# Patient Record
Sex: Female | Born: 1955
Health system: Southern US, Community
[De-identification: ages and names within clinical notes are randomized; demographics above are authoritative.]

## PROBLEM LIST (undated history)

## (undated) DIAGNOSIS — F988 Other specified behavioral and emotional disorders with onset usually occurring in childhood and adolescence: Secondary | ICD-10-CM

## (undated) DIAGNOSIS — K449 Diaphragmatic hernia without obstruction or gangrene: Secondary | ICD-10-CM

## (undated) DIAGNOSIS — M1990 Unspecified osteoarthritis, unspecified site: Secondary | ICD-10-CM

## (undated) DIAGNOSIS — M549 Dorsalgia, unspecified: Secondary | ICD-10-CM

## (undated) DIAGNOSIS — N200 Calculus of kidney: Secondary | ICD-10-CM

## (undated) DIAGNOSIS — K589 Irritable bowel syndrome without diarrhea: Secondary | ICD-10-CM

## (undated) DIAGNOSIS — G589 Mononeuropathy, unspecified: Secondary | ICD-10-CM

## (undated) DIAGNOSIS — F419 Anxiety disorder, unspecified: Secondary | ICD-10-CM

## (undated) DIAGNOSIS — T4145XA Adverse effect of unspecified anesthetic, initial encounter: Secondary | ICD-10-CM

## (undated) DIAGNOSIS — M2550 Pain in unspecified joint: Secondary | ICD-10-CM

## (undated) DIAGNOSIS — E559 Vitamin D deficiency, unspecified: Secondary | ICD-10-CM

## (undated) DIAGNOSIS — K219 Gastro-esophageal reflux disease without esophagitis: Secondary | ICD-10-CM

## (undated) DIAGNOSIS — F410 Panic disorder [episodic paroxysmal anxiety] without agoraphobia: Secondary | ICD-10-CM

## (undated) DIAGNOSIS — Z8489 Family history of other specified conditions: Secondary | ICD-10-CM

## (undated) DIAGNOSIS — M519 Unspecified thoracic, thoracolumbar and lumbosacral intervertebral disc disorder: Secondary | ICD-10-CM

## (undated) DIAGNOSIS — Z87442 Personal history of urinary calculi: Secondary | ICD-10-CM

## (undated) DIAGNOSIS — I10 Essential (primary) hypertension: Secondary | ICD-10-CM

## (undated) HISTORY — DX: Panic disorder (episodic paroxysmal anxiety): F41.0

## (undated) HISTORY — DX: Other specified behavioral and emotional disorders with onset usually occurring in childhood and adolescence: F98.8

## (undated) HISTORY — DX: Irritable bowel syndrome, unspecified: K58.9

## (undated) HISTORY — PX: KNEE ARTHROSCOPY: SUR90

## (undated) HISTORY — DX: Mononeuropathy, unspecified: G58.9

## (undated) HISTORY — DX: Vitamin D deficiency, unspecified: E55.9

## (undated) HISTORY — DX: Dorsalgia, unspecified: M54.9

## (undated) HISTORY — DX: Diaphragmatic hernia without obstruction or gangrene: K44.9

## (undated) HISTORY — DX: Unspecified osteoarthritis, unspecified site: M19.90

## (undated) HISTORY — DX: Anxiety disorder, unspecified: F41.9

## (undated) HISTORY — PX: CHOLECYSTECTOMY: SHX55

## (undated) HISTORY — DX: Calculus of kidney: N20.0

## (undated) HISTORY — DX: Pain in unspecified joint: M25.50

---
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
## 2000-11-21 ENCOUNTER — Other Ambulatory Visit: Admission: RE | Admit: 2000-11-21 | Discharge: 2000-11-21 | Payer: Self-pay | Admitting: Obstetrics & Gynecology

## 2001-02-05 ENCOUNTER — Encounter: Payer: Self-pay | Admitting: Orthopedic Surgery

## 2001-02-05 ENCOUNTER — Encounter: Admission: RE | Admit: 2001-02-05 | Discharge: 2001-02-05 | Payer: Self-pay | Admitting: Orthopedic Surgery

## 2002-03-12 ENCOUNTER — Emergency Department (HOSPITAL_COMMUNITY): Admission: EM | Admit: 2002-03-12 | Discharge: 2002-03-12 | Payer: Self-pay | Admitting: Emergency Medicine

## 2002-03-12 ENCOUNTER — Encounter: Payer: Self-pay | Admitting: Emergency Medicine

## 2002-04-08 ENCOUNTER — Other Ambulatory Visit: Admission: RE | Admit: 2002-04-08 | Discharge: 2002-04-08 | Payer: Self-pay | Admitting: Obstetrics & Gynecology

## 2003-02-09 ENCOUNTER — Emergency Department (HOSPITAL_COMMUNITY): Admission: EM | Admit: 2003-02-09 | Discharge: 2003-02-09 | Payer: Self-pay | Admitting: Emergency Medicine

## 2006-06-22 ENCOUNTER — Other Ambulatory Visit: Admission: RE | Admit: 2006-06-22 | Discharge: 2006-06-22 | Payer: Self-pay | Admitting: Obstetrics and Gynecology

## 2006-09-22 HISTORY — PX: TOTAL ABDOMINAL HYSTERECTOMY: SHX209

## 2006-10-05 ENCOUNTER — Encounter (INDEPENDENT_AMBULATORY_CARE_PROVIDER_SITE_OTHER): Payer: Self-pay | Admitting: Specialist

## 2006-10-05 ENCOUNTER — Inpatient Hospital Stay (HOSPITAL_COMMUNITY): Admission: RE | Admit: 2006-10-05 | Discharge: 2006-10-07 | Payer: Self-pay | Admitting: Obstetrics & Gynecology

## 2006-11-05 ENCOUNTER — Encounter: Admission: RE | Admit: 2006-11-05 | Discharge: 2006-11-05 | Payer: Self-pay | Admitting: Obstetrics & Gynecology

## 2007-11-16 ENCOUNTER — Encounter: Admission: RE | Admit: 2007-11-16 | Discharge: 2008-02-14 | Payer: Self-pay | Admitting: Obstetrics & Gynecology

## 2007-12-06 ENCOUNTER — Encounter: Admission: RE | Admit: 2007-12-06 | Discharge: 2007-12-06 | Payer: Self-pay | Admitting: Obstetrics & Gynecology

## 2008-03-29 ENCOUNTER — Encounter: Admission: RE | Admit: 2008-03-29 | Discharge: 2008-03-29 | Payer: Self-pay | Admitting: Obstetrics & Gynecology

## 2009-10-04 ENCOUNTER — Encounter: Admission: RE | Admit: 2009-10-04 | Discharge: 2009-10-04 | Payer: Self-pay | Admitting: Obstetrics & Gynecology

## 2009-10-30 ENCOUNTER — Encounter: Admission: RE | Admit: 2009-10-30 | Discharge: 2009-10-30 | Payer: Self-pay | Admitting: Obstetrics & Gynecology

## 2010-05-07 ENCOUNTER — Emergency Department (HOSPITAL_COMMUNITY): Admission: EM | Admit: 2010-05-07 | Discharge: 2010-05-07 | Payer: Self-pay | Admitting: Family Medicine

## 2010-10-23 ENCOUNTER — Other Ambulatory Visit: Payer: Self-pay | Admitting: Obstetrics & Gynecology

## 2010-10-23 DIAGNOSIS — Z1231 Encounter for screening mammogram for malignant neoplasm of breast: Secondary | ICD-10-CM

## 2010-10-23 DIAGNOSIS — Z1239 Encounter for other screening for malignant neoplasm of breast: Secondary | ICD-10-CM

## 2010-10-31 ENCOUNTER — Inpatient Hospital Stay: Admission: RE | Admit: 2010-10-31 | Payer: Self-pay | Source: Ambulatory Visit

## 2011-01-29 ENCOUNTER — Inpatient Hospital Stay (INDEPENDENT_AMBULATORY_CARE_PROVIDER_SITE_OTHER)
Admission: RE | Admit: 2011-01-29 | Discharge: 2011-01-29 | Disposition: A | Discharge status: Home / Self Care | Payer: BC Managed Care – PPO | Source: Ambulatory Visit | Attending: Family Medicine | Admitting: Family Medicine

## 2011-01-29 DIAGNOSIS — T50995A Adverse effect of other drugs, medicaments and biological substances, initial encounter: Secondary | ICD-10-CM

## 2011-01-29 DIAGNOSIS — I10 Essential (primary) hypertension: Secondary | ICD-10-CM

## 2011-02-07 NOTE — Discharge Summary (Signed)
NAME:  Cynthia Li, Cynthia Li              ACCOUNT NO.:  [id number]   MEDICAL RECORD NO.:  [id number]          PATIENT TYPE:  INP   LOCATION:  9318                          FACILITY:  [facility]   PHYSICIAN:  M. Leda Quail, MD  DATE OF BIRTH:  1956-03-31   DATE OF ADMISSION:  10/05/2006  DATE OF DISCHARGE:  10/07/2006                               DISCHARGE SUMMARY   ADMISSION DIAGNOSES:  61. A 55 year old gravida 2, para 1, abortus 1, divorced white female      with menorrhagia.  2. Fibroid uterus.  3. Reflux.  4. Anxiety.   DISCHARGE DIAGNOSES:  28. A 55 year old gravida 2, para 1, abortus 1, divorced white female      with menorrhagia.  2. Fibroid uterus.  3. Reflux.  4. Anxiety.   PROCEDURE:  Total abdominal hysterectomy.   HOSPITAL COURSE:  The patient was admitted to same day surgery. She is a  55 year old white female with history of menorrhagia fibroid uterus who  decided to proceed with definitive management with hysterectomy. She  does also have a history of anemia but this has resolved with iron  therapy. The patient did undergo work-up including ultrasound, a Pap  smear and endometrial biopsy with did not show any abnormal pathology.  She did have enlarged ovarian cysts on ultrasound but with follow-up  these have completely resolved. Because of this we have decided to just  do the hysterectomy unless there are abnormalities during the time of  surgery on her ovaries. The patient was taken to the operating room in  the morning of October 07, 2006 where a total abdominal hysterectomy was  performed. The surgery went without complication. She had a 200 cc blood  loss; made appropriate urine during the procedure. An On Cue pump was  used for local pain management after the surgery as well as Dilaudid  reduced dose PCA. The patient was taken from the OR to the recovery room  and from there was transferred to the third floor for the remainder of  her hospitalization. The  patient's hospitalization went without event.  In te evening of postoperative day zero she was doing well, vital signs  were stable, she was afebrile. She did not have any nausea and had  excellent pain control. By the morning of postoperative day 1 the  patient was ready to eat. She had stable vital signs over night and was  afebrile. Her postoperative hemoglobin was 10.5, white count 9400,  platelet count 236,000.  Her electrolytes were normal.  Sodium 130,  potassium 3.7, BUN and creatinine were 5 and 0.6 and glucose was 98. As  the patient had done well overnight and had good bowel sounds, her diet  was advanced to a regular diet. Foley catheter was discontinued and her  PCA and IV were removed. The patient tolerated food well. She was able  to ambulate and void without difficulty. She was also able to shower on  postoperative day one. The remainder of postoperative day one was  without event. In the morning of postoperative day 2 she was ambulating,  voiding without difficulty, had  passed flatus and was tolerating all  pain medications by mouth. She had excellent pain control. Her exam was  benign. Her incision was clean, dry and intact. At this point discharge  was felt appropriate.  Her On Cue pump tubing was removed prior to discharge.   DISCHARGE INSTRUCTIONS:  Were provided in the written and verbal form.  She is to see me in 1 week on October 14, 2006 for an incision check.  She was given prescriptions for Percocet and Motrin to use for pain  management.   PATHOLOGY:  At this point her pathology report is still pending and will  be discussed with her at follow-up.      [name], MD  Electronically Signed     MSM/MEDQ  D:  10/07/2006  T:  10/07/2006  Job:  [id number]

## 2011-02-07 NOTE — Op Note (Signed)
NAME:  TAILEY, TOP              ACCOUNT NO.:  [id number]   MEDICAL RECORD NO.:  [id number]          PATIENT TYPE:  AMB   LOCATION:  SDC                           FACILITY:  [facility]   PHYSICIAN:  M. Leda Quail, MD  DATE OF BIRTH:  [birth year]   DATE OF PROCEDURE:  10/05/2006  DATE OF DISCHARGE:                               OPERATIVE REPORT   PREOPERATIVE DIAGNOSES:  68. A 55 year old G2, P1, A1 divorced white female with menorrhagia.  2. Dysmenorrhea.  3. Fibroid uterus.  4. Reflux disease.  5. Obesity.   POSTOPERATIVE DIAGNOSES:  79. A 55 year old G2, P1, A1 divorced white female with menorrhagia.  2. Dysmenorrhea.  3. Fibroid uterus.  4. Reflux disease.  5. Obesity.   PROCEDURE:  TAH.   SURGEON:  M. Leda Quail, MD.   ASSISTANTAram Beecham P. Romine, M.D.   ANESTHESIA:  General endotracheal anesthesia.   SPECIMENS:  Uterus and cervix to pathology.   ESTIMATED BLOOD LOSS:  200 mL.   URINE OUTPUT:  150 mL clear urine.   FLUIDS:  2100 mL of LR.   COMPLICATIONS:  None.   INDICATIONS FOR PROCEDURE:  Ms. Eckman is a very pleasant 55 year old  white female with a history of symptomatic uterine fibroids including  menorrhagia and dysmenorrhea.  She has also had anemia with a hemoglobin  down to 10.  We have, with iron, improved her hemoglobin to 13.  Because  of the amount of bleeding that she has had, she also underwent  evaluation with endometrial biopsy which showed no pathology.  We did  discuss conservative management with an IUD, simply birth control pills  or endometrial ablation but she is interested in definitive management  and decided to proceed with hysterectomy.  She is here for this today.   PROCEDURE:  Patient is taken to the operating room.  She is placed in  the supine position.  General endotracheal anesthesia is administered by  the anesthesia staff.  Legs are positioned in the frog-leg position.  Abdomen, peritoneum, inner thighs and vagina are  prepped in normal  sterile fashion.  Foley catheter is inserted under sterile conditions.  Legs are straightened.  Patient is draped in a normal sterile fashion.   Using a knife, a Pfannenstiel skin incision is made and carried down  through the subcutaneous fat and tissue.  Fascia is identified and  nicked in the midline.  The fascial incision is extended laterally with  curved Mayo scissors.  Kocher clamps were applied to the superior aspect  of the fascial incision and the underlying rectus muscles are taken off  of the fascia bluntly and sharply in a similar fashion.  Kocher clamps  are applied to inferior aspect of the fascial incision and the fascia is  elevated, the underlying rectus muscles are dissected off bluntly and  sharply.  Rectus muscles are divided in the midline.  The free  peritoneal fat is divided superiorly, the peritoneum is entered bluntly.  The peritoneal incision is extended superiorly and inferiorly with care  taken to note location of the bladder.   Initially, an abdominal  retractor was used but I felt there was an  adequate visualization.  Therefore, a Engineer, manufacturing was obtained.  A  bladder blade was attached to the rectractor inferiorly.  Three  moistened laparotomy sponges were used to pack the bowel superiorly and  an upper blade was attached to the Balfour retractor with a malleable  retractor to keep the bowel out of the surgical field.   There is a small amount of bowel adhesion to the ovary on the left side.  This was taken off sharply.  The Kelly clamps were placed on either side  of the utero-ovarian pedicles.  Attention was turned to the right round  ligament.  This was suture ligated with 0 Vicryl and incised with  cautery.  Anterior leaf of the broad ligament was opened to the level of  the internal os anteriorly.  Then the posterior leaf of the broad  ligament was opened parallel to the IP ligament.  The utero-ovarian  pedicle was isolated,  clamped with curved Heaney clamp, back-clamping  with Kelly clamp was used.  This pedicle was transected and suture  ligated with free tie of 0 Vicryl and a stitch tie of 0 Vicryl.   Attention was turned to the left side.  The left round ligament was  suture ligated with 0 Vicryl and incised with a cautery.  Then the  anterior leaf of the broad ligament was opened to the level of the  internal os and connecting with the previous incision done on the right  side.  Then the posterior leaf of the broad ligament was opened incising  parallel to the IP ligament.  The utero-ovarian pedicle was isolated on  the left side.  Clamped with a curved Heaney clamp.  The pedicle was  then incised and suture ligated with free tie of 0 Vicryl and then with  a stitch tie.  Attention was turned anteriorly to the bladder.  The  pubovesicale cervical fascia was dissected and using a sponge stick the  bladder was pushed down.  The cervix and glistening white tissue of the  cervix was well visualized.  Patient had no previous surgeries.  This  went without complications.  Attention was then turned to the patient's  right side again. The uterine artery was then skeletonized, clamped with  a curved Heaney clamp.  Back-clamping was performed.  This pedicle was  transected and suture ligated with two stitch ties of 0 Vicryl.  In a  similar fashion, on the opposite side, the uterine artery was  skeletonized, clamped with a curved Heaney clamp, transected and suture  ligated with two stitches of 0 Vicryl.  Then the cardinal ligaments were  serially clamped.  Transected and suture ligated with 0 Vicryl down  either side of the uterus.  Further dissection of the bladder flap was  performed as necessary.  At the base of the cervix, a curved Heaney  clamp was used to clamp the uterosacral ligament.  This was done on each side.  This pedicle was transected and suture ligated with Heaney stitch  of 0 Vicryl.  Then curved  Heaney clamps were placed on either side of  the corner of the cervix.  Pedicles were transected and the vagina was  entered.  Pedicle was suture ligated with a Heaney stitch of 0 Vicryl.  These stitches were left long.  Then using Jorgenson scissors, the  vaginal mucosa was incised circumferentially, keeping close to the  cervix.  Kocher clamps were used to  grasp the vaginal mucosa.  Once the  uterus and cervix were freed from the vaginal mucosa, they were handed  off to be sent to pathology.  At this point, the corners of the vaginal  cuff were stitched with a 0 Vicryl and attached to the ipsilateral  uterosacral ligament.  Care was taken to ensure that the corner of the  vaginal mucosa was well within the stitch. Then the remainder of the  vagina was closed with four interrupted figure-of-eight sutures of 0  Vicryl.  At this point, pelvis was irrigated with copious amounts of  warm normal saline.  There was a small amount of bleeding on the right  side at the level of one of the cardinal ligament pedicles.  A  superficial figure-of-eight suture of 0 Vicryl was used to obtain  excellent hemostasis on this side.  The utero-ovarian pedicles were  hemostatic.  Uterine arteries were hemostatic.  Round ligaments were  hemostatic.  Vaginal cuff was hemostatic.  At this point, all sutures  were cut short.  The ovaries were attached to the ipsilateral round  ligaments keeping them out of the pelvis.  At this point, no bleeding  was noted and the procedure was noted and the procedure was ended.   The Balfour retractor, bladder blade and upper blade were removed.  The  laparotomy sponges were removed.  There were three that were present and  removed.  Bowel was placed back in normal anatomic position with the  omentum on top of it.  The peritoneum was closed using a running stitch  of 0 Vicryl.  The subfascial tissue and rectus muscle were visualized.  No bleeding was noted.  An on-cue pump  tubing was placed subfascially on  the patient's right side.  Steri-Strips were used to hold the tubing in  place at skin level.  Then the fascia was closed with a running stitch  of 0 Vicryl starting at the corners and running to the midline.  This  was done bilaterally.  The subcutaneous fat and tissues irrigated.  Hemostasis was achieved with cautery.  The second on-cue pump tubing was  placed on the patient's left side.  This was placed subcutaneously.  The  skin was closed with a subcuticular stitch of 3-0 Vicryl.  Benzoin and  Steri-Strips were applied to the incision.  The left on-cue pump tubing  was attached to the skin using Steri-Strips.  Then Op-Sites were used to  dress the incisions.  The on-cue pump tubing was then primed with 5 mL  of 1% Xylocaine subcutaneously and 10 mL 1% Xylocaine subfascially and  lidocaine was placed in the on-cue pump to be used for topical  anesthesia later.   The patient tolerated the procedure well.  Sponge, lap, needle and  instrument counts correct x2.  She was extubated from general  endotracheal anesthesia without difficulty and taken to the recovery  room in stable condition.      [name], MD  Electronically Signed     MSM/MEDQ  D:  10/05/2006  T:  10/05/2006  Job:  [id number]

## 2011-03-11 ENCOUNTER — Ambulatory Visit
Admission: RE | Admit: 2011-03-11 | Discharge: 2011-03-11 | Disposition: A | Discharge status: Home / Self Care | Payer: BC Managed Care – PPO | Source: Ambulatory Visit | Attending: Obstetrics & Gynecology | Admitting: Obstetrics & Gynecology

## 2011-03-11 DIAGNOSIS — Z1231 Encounter for screening mammogram for malignant neoplasm of breast: Secondary | ICD-10-CM

## 2012-05-10 ENCOUNTER — Other Ambulatory Visit: Payer: Self-pay | Admitting: Obstetrics & Gynecology

## 2012-05-10 DIAGNOSIS — Z1231 Encounter for screening mammogram for malignant neoplasm of breast: Secondary | ICD-10-CM

## 2012-05-18 ENCOUNTER — Ambulatory Visit: Payer: BC Managed Care – PPO

## 2012-11-16 ENCOUNTER — Ambulatory Visit
Admission: RE | Admit: 2012-11-16 | Discharge: 2012-11-16 | Disposition: A | Discharge status: Home / Self Care | Payer: BC Managed Care – PPO | Source: Ambulatory Visit | Attending: Obstetrics & Gynecology | Admitting: Obstetrics & Gynecology

## 2012-11-20 DIAGNOSIS — N200 Calculus of kidney: Secondary | ICD-10-CM

## 2012-11-20 HISTORY — DX: Calculus of kidney: N20.0

## 2012-12-06 ENCOUNTER — Encounter: Payer: Self-pay | Admitting: Obstetrics & Gynecology

## 2012-12-21 ENCOUNTER — Ambulatory Visit: Payer: Self-pay | Admitting: Obstetrics & Gynecology

## 2013-03-03 ENCOUNTER — Telehealth: Payer: Self-pay | Admitting: *Deleted

## 2013-03-03 NOTE — Telephone Encounter (Signed)
Message left for pt to return call regarding 6/20 appt.

## 2013-03-09 ENCOUNTER — Encounter: Payer: Self-pay | Admitting: Obstetrics & Gynecology

## 2013-03-11 ENCOUNTER — Ambulatory Visit: Payer: Self-pay | Admitting: Obstetrics & Gynecology

## 2013-03-23 ENCOUNTER — Ambulatory Visit (INDEPENDENT_AMBULATORY_CARE_PROVIDER_SITE_OTHER): Payer: BC Managed Care – PPO | Admitting: Obstetrics & Gynecology

## 2013-03-23 ENCOUNTER — Encounter: Payer: Self-pay | Admitting: Obstetrics & Gynecology

## 2013-03-23 VITALS — BP 120/72 | HR 60 | Resp 16 | Ht 65.0 in | Wt 206.4 lb

## 2013-03-23 DIAGNOSIS — Z0000 Encounter for general adult medical examination without abnormal findings: Secondary | ICD-10-CM

## 2013-03-23 DIAGNOSIS — Z01419 Encounter for gynecological examination (general) (routine) without abnormal findings: Secondary | ICD-10-CM

## 2013-03-23 DIAGNOSIS — N390 Urinary tract infection, site not specified: Secondary | ICD-10-CM

## 2013-03-23 LAB — POCT URINALYSIS DIPSTICK
Bilirubin, UA: NEGATIVE
Glucose, UA: NEGATIVE
Urobilinogen, UA: NEGATIVE
pH, UA: 5

## 2013-03-23 LAB — HEMOGLOBIN, FINGERSTICK: Hemoglobin, fingerstick: 12.6 g/dL (ref 12.0–16.0)

## 2013-03-23 MED ORDER — PHENTERMINE HCL 15 MG PO CAPS: 15.0000 mg | ORAL_CAPSULE | ORAL | Status: DC

## 2013-03-23 NOTE — Progress Notes (Signed)
57 y.o. [id number] SingleCaucasianF here for annual exam.  Reports that she was treated for a UTI.  Finished antibiotics 03/21/13.  Working on dietary changes.  She is having a lot of back issues.  She has an appointment at [facility].    Patient's last menstrual period was 09/22/2006.          Sexually active: no  The current method of family planning is status post hysterectomy.    Exercising: no  not regularly Smoker:  no  Health Maintenance: Pap:  06/22/06 WNL History of abnormal Pap:  no MMG:  11/16/12 3D normal Colonoscopy:  None, refuses BMD:   2011 normal TDaP:  2013, [facility] Labs: [facility], Hb today: 12.6, Urine today: WBC-trace, PROTEIN-trace, RBC-trace   reports that she has never smoked. She has never used smokeless tobacco. She reports that  drinks alcohol. She reports that she does not use illicit drugs.  Past Medical History  Diagnosis Date  . IBS (irritable bowel syndrome)     age 57  . Panic disorder   . Kidney stone 3/14    no surgery    Past Surgical History  Procedure Laterality Date  . Total abdominal hysterectomy  09/2006  . Cholecystectomy  age 39  . Knee arthroscopy      right    Current Outpatient Prescriptions  Medication Sig Dispense Refill  . cholecalciferol (VITAMIN D) 1000 UNITS tablet Take 1,000 Units by mouth daily.      . CHROMIUM PICOLINATE PO Take by mouth.      . Flaxseed, Linseed, (FLAXSEED OIL PO) Take by mouth.      Marland Kitchen GINSENG PO Take by mouth.      . Glucosamine HCl (GLUCOSAMINE PO) Take by mouth daily.      Marland Kitchen KRILL OIL PO Take by mouth. Omega 3 red krill oil      . Multiple Vitamin (MULTIVITAMIN) capsule Take 1 capsule by mouth daily.      . pantoprazole (PROTONIX) 20 MG tablet Take 40 mg by mouth daily.      . vitamin E 400 UNIT capsule Take 400 Units by mouth daily.      Marland Kitchen glucosamine-chondroitin 500-400 MG tablet Take 1 tablet by mouth 3 (three) times daily.       No current facility-administered medications for  this visit.    Family History  Problem Relation Age of Onset  . Parkinson's disease Father     deceased  . Cancer Father     renal tumor  . Ulcerative colitis Sister   . Heart attack Father   . Alzheimer's disease Father     ROS:  Pertinent items are noted in HPI.  Otherwise, a comprehensive ROS was negative.  Exam:   BP 120/72  Pulse 60  Resp 16  Ht 5\' 5"  (1.651 m)  Wt 206 lb 6.4 oz (93.622 kg)  BMI 34.35 kg/m2  LMP 09/22/2006  Weight change: -6lbs  Height: 5\' 5"  (165.1 cm)  Ht Readings from Last 3 Encounters:  03/23/13 5\' 5"  (1.651 m)    General appearance: alert, cooperative and appears stated age Head: Normocephalic, without obvious abnormality, atraumatic Neck: no adenopathy, supple, symmetrical, trachea midline and thyroid normal to inspection and palpation Lungs: clear to auscultation bilaterally Breasts: normal appearance, no masses or tenderness Heart: regular rate and rhythm Abdomen: soft, non-tender; bowel sounds normal; no masses,  no organomegaly Extremities: extremities normal, atraumatic, no cyanosis or edema Skin: Skin color, texture, turgor normal. No rashes or  lesions Lymph nodes: Cervical, supraclavicular, and axillary nodes normal. No abnormal inguinal nodes palpated Neurologic: Grossly normal   Pelvic: External genitalia:  no lesions              Urethra:  normal appearing urethra with no masses, tenderness or lesions              Bartholins and Skenes: normal                 Vagina: normal appearing vagina with normal color and discharge, no lesions              Cervix: absent              Pap taken: no Bimanual Exam:  Uterus:  uterus absent              Adnexa: normal adnexa and no mass, fullness, tenderness               Rectovaginal: Confirms               Anus:  normal sphincter tone, no lesions  A:  Well Woman with normal exam Labs with PCP Declines colonoscopy!!   Elevated Lipids Desires weight loss S/P TAH, no HRT  P:    Mammogram yearly No pap smear.  H/O TAH. OC light return annually or prn  An After Visit Summary was printed and given to the patient.

## 2013-03-23 NOTE — Patient Instructions (Addendum)

## 2013-03-24 LAB — URINE CULTURE
Colony Count: NO GROWTH
Organism ID, Bacteria: NO GROWTH

## 2013-03-28 ENCOUNTER — Telehealth: Payer: Self-pay

## 2013-03-28 NOTE — Telephone Encounter (Signed)
7/7 patient notified of all results.

## 2013-03-28 NOTE — Telephone Encounter (Signed)
Message copied by Elisha Headland on Mon Mar 28, 2013  9:08 AM ------      Message from: Jerene Bears      Created: Sat Mar 26, 2013  9:16 PM       Inform culture negative.  Urine had mild findings at AEX.  No abx needed. ------

## 2013-03-28 NOTE — Telephone Encounter (Signed)
7/7 lmtcb//kn

## 2013-04-22 ENCOUNTER — Telehealth: Payer: Self-pay | Admitting: Obstetrics & Gynecology

## 2013-04-22 NOTE — Telephone Encounter (Signed)
Patient wants Dr.Miller to call her regarding her back. She has talked to Dr. Hyacinth Meeker before about this. Patient is going into a meeting 3 :30 pm.

## 2013-04-25 NOTE — Telephone Encounter (Signed)
LMTCB that I am returning call for Dr Hyacinth Meeker.

## 2013-04-28 ENCOUNTER — Ambulatory Visit: Payer: BC Managed Care – PPO | Admitting: Obstetrics & Gynecology

## 2013-05-09 ENCOUNTER — Telehealth: Payer: Self-pay | Admitting: Obstetrics & Gynecology

## 2013-05-09 NOTE — Telephone Encounter (Signed)
Patient one month follow up appt. With Dr. Hyacinth Meeker cancelled per patient request . Stated she did not ever get Rx for Phentermine filled and did not need to keep appt.. Patient would like a call back concerning a procedure at [facility]. See note below.

## 2013-05-09 NOTE — Telephone Encounter (Signed)
Call to patient to adjust her appt time. She asked if we could cancel the appointment since she never started the medicine and said she will call back to reschedule. She asked to speak with Tresa Endo. She has a question about a spinal procedure that [facility], Dr. Roetta Sessions had recommended. She wants Dr. Rondel Baton input before she agrees to the procedure.

## 2013-05-10 NOTE — Telephone Encounter (Signed)
I left her a message to call back, have not heard from her. Do you want me to continue to try her or have you also contacted her? Please advise.

## 2013-05-12 ENCOUNTER — Ambulatory Visit: Payer: BC Managed Care – PPO | Admitting: Obstetrics & Gynecology

## 2013-05-13 NOTE — Telephone Encounter (Signed)
Left msg for pt TCB.

## 2013-10-11 ENCOUNTER — Other Ambulatory Visit: Payer: Self-pay

## 2013-10-11 DIAGNOSIS — Z1231 Encounter for screening mammogram for malignant neoplasm of breast: Secondary | ICD-10-CM

## 2013-11-17 ENCOUNTER — Ambulatory Visit: Payer: BC Managed Care – PPO

## 2013-12-01 ENCOUNTER — Ambulatory Visit
Admission: RE | Admit: 2013-12-01 | Discharge: 2013-12-01 | Disposition: A | Discharge status: Home / Self Care | Payer: BC Managed Care – PPO | Source: Ambulatory Visit

## 2013-12-01 DIAGNOSIS — Z1231 Encounter for screening mammogram for malignant neoplasm of breast: Secondary | ICD-10-CM

## 2014-06-01 ENCOUNTER — Ambulatory Visit (INDEPENDENT_AMBULATORY_CARE_PROVIDER_SITE_OTHER): Payer: BC Managed Care – PPO | Admitting: Obstetrics & Gynecology

## 2014-06-01 ENCOUNTER — Encounter: Payer: Self-pay | Admitting: Obstetrics & Gynecology

## 2014-06-01 VITALS — BP 132/78 | HR 64 | Resp 16 | Ht 65.0 in | Wt 214.8 lb

## 2014-06-01 DIAGNOSIS — Z01419 Encounter for gynecological examination (general) (routine) without abnormal findings: Secondary | ICD-10-CM

## 2014-06-01 DIAGNOSIS — N3000 Acute cystitis without hematuria: Secondary | ICD-10-CM

## 2014-06-01 DIAGNOSIS — N3001 Acute cystitis with hematuria: Secondary | ICD-10-CM

## 2014-06-01 DIAGNOSIS — Z0000 Encounter for general adult medical examination without abnormal findings: Secondary | ICD-10-CM

## 2014-06-01 DIAGNOSIS — R3129 Other microscopic hematuria: Secondary | ICD-10-CM

## 2014-06-01 DIAGNOSIS — Z1211 Encounter for screening for malignant neoplasm of colon: Secondary | ICD-10-CM

## 2014-06-01 LAB — POCT URINALYSIS DIPSTICK
BILIRUBIN UA: NEGATIVE
GLUCOSE UA: NEGATIVE
KETONES UA: NEGATIVE
Nitrite, UA: NEGATIVE
PH UA: 5
Urobilinogen, UA: NEGATIVE

## 2014-06-01 LAB — HEMOGLOBIN, FINGERSTICK: Hemoglobin, fingerstick: 13.8 g/dL (ref 12.0–16.0)

## 2014-06-01 MED ORDER — SULFAMETHOXAZOLE-TMP DS 800-160 MG PO TABS
1.0000 | ORAL_TABLET | Freq: Two times a day (BID) | ORAL | Status: DC
Start: 2014-06-01 — End: 2014-10-14

## 2014-06-01 NOTE — Progress Notes (Signed)
58 y.o. [id number] SingleCaucasianF here for annual exam.  Doing well.  Clayton Bibles, is four.   No vaginal bleeding.  Pt reports she feels like she has a UTI.  Having some increased urination and some low back pain.   Patient's last menstrual period was 09/22/2006.          Sexually active: No.  The current method of family planning is status post hysterectomy.    Exercising: Yes.    some Smoker:  no  Health Maintenance: Pap:  06/22/06 WNL History of abnormal Pap:  no MMG:  12/02/13 3D-normal Colonoscopy:  none BMD:   2011-normal TDaP:  Will check with PCP Screening Labs: appt November with PCP, Hb today: 13.8, Urine today: WBC-+2, PROTEIN-+2, RBC-+2   reports that she has never smoked. She has never used smokeless tobacco. She reports that she drinks alcohol. She reports that she does not use illicit drugs.  Past Medical History  Diagnosis Date  . IBS (irritable bowel syndrome)     age 23  . Panic disorder   . Kidney stone 3/14    no surgery  . Hiatal hernia   . Pinched nerve     some DDD    Past Surgical History  Procedure Laterality Date  . Total abdominal hysterectomy  09/2006  . Cholecystectomy  age 67  . Knee arthroscopy      right    Current Outpatient Prescriptions  Medication Sig Dispense Refill  . cholecalciferol (VITAMIN D) 1000 UNITS tablet Take 1,000 Units by mouth daily.      . CHROMIUM PICOLINATE PO Take by mouth.      . Flaxseed, Linseed, (FLAXSEED OIL PO) Take by mouth.      Marland Kitchen GINSENG PO Take by mouth.      . Glucosamine HCl (GLUCOSAMINE PO) Take by mouth daily.      Marland Kitchen glucosamine-chondroitin 500-400 MG tablet Take 1 tablet by mouth 3 (three) times daily.      Marland Kitchen KRILL OIL PO Take by mouth. Omega 3 red krill oil      . Multiple Vitamin (MULTIVITAMIN) capsule Take 1 capsule by mouth daily.      . pantoprazole (PROTONIX) 20 MG tablet Take 40 mg by mouth daily.      . vitamin E 400 UNIT capsule Take 400 Units by mouth daily.       No current  facility-administered medications for this visit.    Family History  Problem Relation Age of Onset  . Parkinson's disease Father     deceased  . Cancer Father     renal tumor  . Ulcerative colitis Sister   . Heart attack Father   . Alzheimer's disease Father     ROS:  Pertinent items are noted in HPI.  Otherwise, a comprehensive ROS was negative.  Exam:   BP 132/78  Pulse 64  Resp 16  Ht  (1.651 m)  Wt 214 lb 12.8 oz (97.433 kg)  BMI 35.74 kg/m2  LMP 09/22/2006  Weight change: +8#   Height:  (165.1 cm)  Ht Readings from Last 3 Encounters:  06/01/14  (1.651 m)  03/23/13  (1.651 m)    General appearance: alert, cooperative and appears stated age Head: Normocephalic, without obvious abnormality, atraumatic Neck: no adenopathy, supple, symmetrical, trachea midline and thyroid normal to inspection and palpation Lungs: clear to auscultation bilaterally Breasts: normal appearance, no masses or tenderness Heart: regular rate and rhythm Abdomen: soft, non-tender; bowel sounds normal; no  masses,  no organomegaly Extremities: extremities normal, atraumatic, no cyanosis or edema Skin: Skin color, texture, turgor normal. No rashes or lesions Lymph nodes: Cervical, supraclavicular, and axillary nodes normal. No abnormal inguinal nodes palpated Neurologic: Grossly normal   Pelvic: External genitalia:  no lesions              Urethra:  normal appearing urethra with no masses, tenderness or lesions              Bartholins and Skenes: normal                 Vagina: normal appearing vagina with normal color and discharge, no lesions              Cervix: absent              Pap taken: No. Bimanual Exam:  Uterus:  uterus absent              Adnexa: normal adnexa and no mass, fullness, tenderness               Rectovaginal: Confirms               Anus:  normal sphincter tone, no lesions  A:  Well Woman with normal exam  Labs with PCP  Declines colonoscopy!!  IFOB  given. Elevated Lipids   S/P TAH, no HRT   P: Mammogram yearly. No pap smear. H/O TAH.  OC light given today Will see Dr. Sharon Mt next month.   return annually or prn  An After Visit Summary was printed and given to the patient.

## 2014-06-03 LAB — URINE CULTURE

## 2014-06-04 NOTE — Addendum Note (Signed)
Addended by: Jerene Bears on: 06/04/2014 03:16 PM   Modules accepted: Orders

## 2014-06-05 ENCOUNTER — Telehealth: Payer: Self-pay

## 2014-06-05 NOTE — Telephone Encounter (Signed)
Message copied by Elisha Headland on Mon Jun 05, 2014  9:45 AM ------      Message from: Jerene Bears      Created: Sun Jun 04, 2014  3:15 PM       Inform culture grew bacteria but nothing specific.  Finish antibiotics and return for urinalysis only.  THanks.  Order placed. ------

## 2014-06-05 NOTE — Telephone Encounter (Signed)
Lmtcb//kn 

## 2014-06-12 NOTE — Telephone Encounter (Signed)
Patient notified by R. Thresa Ross, CMA, of results.//kn

## 2014-06-13 ENCOUNTER — Telehealth: Payer: Self-pay | Admitting: Obstetrics & Gynecology

## 2014-06-13 NOTE — Telephone Encounter (Signed)
Pt is calling kaitlyn back said to tell them its an emergnecy and to put her through to [employer]

## 2014-06-13 NOTE — Telephone Encounter (Signed)
Left message to call Symeon Puleo at [phone]. 

## 2014-06-13 NOTE — Telephone Encounter (Signed)
Requesting results of blood work from annual.  Advised of hemoglobin results. Hemoglobin, fingerstick 12.0 - 16.0 g/dL  16.1   Advised normal. Patient also would like to schedule urine test of cure.  Scheduled for 06/15/14 at 1600. Patient agreeable.  Routing to provider for final review. Patient agreeable to disposition. Will close encounter

## 2014-06-13 NOTE — Telephone Encounter (Signed)
Patient is asking for recent lab results from [facility].

## 2014-06-15 ENCOUNTER — Ambulatory Visit: Payer: BC Managed Care – PPO

## 2014-06-19 ENCOUNTER — Ambulatory Visit (INDEPENDENT_AMBULATORY_CARE_PROVIDER_SITE_OTHER): Payer: BC Managed Care – PPO

## 2014-06-19 VITALS — BP 118/82 | HR 72 | Ht 65.0 in | Wt 218.0 lb

## 2014-06-19 DIAGNOSIS — R3129 Other microscopic hematuria: Secondary | ICD-10-CM

## 2014-06-19 NOTE — Progress Notes (Signed)
Pt came in for a nurse visit to give a urine TOC Pt states no sx and has finished antibiotics.  Micro sent to lab

## 2014-06-20 LAB — URINALYSIS, MICROSCOPIC ONLY
Bacteria, UA: NONE SEEN
CRYSTALS: NONE SEEN
Casts: NONE SEEN
SQUAMOUS EPITHELIAL / LPF: NONE SEEN

## 2014-07-05 LAB — FECAL OCCULT BLOOD, IMMUNOCHEMICAL: IMMUNOLOGICAL FECAL OCCULT BLOOD TEST: NEGATIVE

## 2014-07-24 ENCOUNTER — Encounter: Payer: Self-pay | Admitting: Obstetrics & Gynecology

## 2014-07-24 ENCOUNTER — Other Ambulatory Visit: Payer: Self-pay | Admitting: Neurosurgery

## 2014-07-24 DIAGNOSIS — M545 Low back pain, unspecified: Secondary | ICD-10-CM

## 2014-07-26 ENCOUNTER — Ambulatory Visit
Admission: RE | Admit: 2014-07-26 | Discharge: 2014-07-26 | Disposition: A | Discharge status: Home / Self Care | Payer: BC Managed Care – PPO | Source: Ambulatory Visit | Attending: Neurosurgery | Admitting: Neurosurgery

## 2014-07-26 DIAGNOSIS — M545 Low back pain, unspecified: Secondary | ICD-10-CM

## 2014-09-10 ENCOUNTER — Encounter (HOSPITAL_COMMUNITY): Payer: Self-pay | Admitting: Emergency Medicine

## 2014-09-10 ENCOUNTER — Emergency Department (HOSPITAL_COMMUNITY)
Admission: EM | Admit: 2014-09-10 | Discharge: 2014-09-10 | Disposition: A | Discharge status: Home / Self Care | Payer: BC Managed Care – PPO | Attending: Emergency Medicine | Admitting: Emergency Medicine

## 2014-09-10 ENCOUNTER — Emergency Department (HOSPITAL_COMMUNITY): Payer: BC Managed Care – PPO

## 2014-09-10 DIAGNOSIS — Z8669 Personal history of other diseases of the nervous system and sense organs: Secondary | ICD-10-CM | POA: Diagnosis not present

## 2014-09-10 DIAGNOSIS — N200 Calculus of kidney: Secondary | ICD-10-CM

## 2014-09-10 DIAGNOSIS — Z8719 Personal history of other diseases of the digestive system: Secondary | ICD-10-CM | POA: Insufficient documentation

## 2014-09-10 DIAGNOSIS — R109 Unspecified abdominal pain: Secondary | ICD-10-CM | POA: Diagnosis present

## 2014-09-10 DIAGNOSIS — R300 Dysuria: Secondary | ICD-10-CM

## 2014-09-10 DIAGNOSIS — Z79899 Other long term (current) drug therapy: Secondary | ICD-10-CM | POA: Diagnosis not present

## 2014-09-10 DIAGNOSIS — Z8659 Personal history of other mental and behavioral disorders: Secondary | ICD-10-CM | POA: Insufficient documentation

## 2014-09-10 DIAGNOSIS — Z880 Allergy status to penicillin: Secondary | ICD-10-CM | POA: Diagnosis not present

## 2014-09-10 DIAGNOSIS — R319 Hematuria, unspecified: Secondary | ICD-10-CM

## 2014-09-10 DIAGNOSIS — R110 Nausea: Secondary | ICD-10-CM

## 2014-09-10 DIAGNOSIS — Z792 Long term (current) use of antibiotics: Secondary | ICD-10-CM | POA: Insufficient documentation

## 2014-09-10 LAB — LIPASE, BLOOD: Lipase: 29 U/L (ref 11–59)

## 2014-09-10 LAB — COMPREHENSIVE METABOLIC PANEL
ALT: 15 U/L (ref 0–35)
AST: 19 U/L (ref 0–37)
Albumin: 3.9 g/dL (ref 3.5–5.2)
Alkaline Phosphatase: 92 U/L (ref 39–117)
Anion gap: 13 (ref 5–15)
BILIRUBIN TOTAL: 0.4 mg/dL (ref 0.3–1.2)
BUN: 9 mg/dL (ref 6–23)
CALCIUM: 9.9 mg/dL (ref 8.4–10.5)
CO2: 25 meq/L (ref 19–32)
Chloride: 100 mEq/L (ref 96–112)
Creatinine, Ser: 0.74 mg/dL (ref 0.50–1.10)
GLUCOSE: 115 mg/dL — AB (ref 70–99)
Potassium: 3.9 mEq/L (ref 3.7–5.3)
Sodium: 138 mEq/L (ref 137–147)
Total Protein: 7.2 g/dL (ref 6.0–8.3)

## 2014-09-10 LAB — CBC WITH DIFFERENTIAL/PLATELET
Basophils Absolute: 0 10*3/uL (ref 0.0–0.1)
Basophils Relative: 0 % (ref 0–1)
Eosinophils Absolute: 0.1 10*3/uL (ref 0.0–0.7)
Eosinophils Relative: 1 % (ref 0–5)
HEMATOCRIT: 39.5 % (ref 36.0–46.0)
Hemoglobin: 13.4 g/dL (ref 12.0–15.0)
LYMPHS ABS: 1.2 10*3/uL (ref 0.7–4.0)
LYMPHS PCT: 14 % (ref 12–46)
MCH: 30.5 pg (ref 26.0–34.0)
MCHC: 33.9 g/dL (ref 30.0–36.0)
MCV: 90 fL (ref 78.0–100.0)
Monocytes Absolute: 0.6 10*3/uL (ref 0.1–1.0)
Monocytes Relative: 7 % (ref 3–12)
Neutro Abs: 6.9 10*3/uL (ref 1.7–7.7)
Neutrophils Relative %: 78 % — ABNORMAL HIGH (ref 43–77)
PLATELETS: 256 10*3/uL (ref 150–400)
RBC: 4.39 MIL/uL (ref 3.87–5.11)
RDW: 12.6 % (ref 11.5–15.5)
WBC: 8.9 10*3/uL (ref 4.0–10.5)

## 2014-09-10 LAB — URINALYSIS, ROUTINE W REFLEX MICROSCOPIC
Bilirubin Urine: NEGATIVE
GLUCOSE, UA: NEGATIVE mg/dL
Ketones, ur: NEGATIVE mg/dL
Nitrite: NEGATIVE
PH: 6.5 (ref 5.0–8.0)
Protein, ur: NEGATIVE mg/dL
SPECIFIC GRAVITY, URINE: 1.005 (ref 1.005–1.030)
Urobilinogen, UA: 0.2 mg/dL (ref 0.0–1.0)

## 2014-09-10 LAB — URINE MICROSCOPIC-ADD ON

## 2014-09-10 MED ORDER — ONDANSETRON 4 MG PO TBDP: 4.0000 mg | ORAL_TABLET | Freq: Three times a day (TID) | ORAL | Status: DC | PRN

## 2014-09-10 MED ORDER — HYDROCODONE-ACETAMINOPHEN 5-325 MG PO TABS: 1.0000 | ORAL_TABLET | Freq: Four times a day (QID) | ORAL | Status: DC | PRN

## 2014-09-10 MED ORDER — MORPHINE SULFATE 4 MG/ML IJ SOLN
4.0000 mg | Freq: Once | INTRAMUSCULAR | Status: DC
  Filled 2014-09-10: qty 1

## 2014-09-10 MED ORDER — KETOROLAC TROMETHAMINE 30 MG/ML IJ SOLN
30.0000 mg | Freq: Once | INTRAMUSCULAR | Status: AC
  Administered 2014-09-10: 30 mg via INTRAVENOUS

## 2014-09-10 MED ORDER — KETOROLAC TROMETHAMINE 30 MG/ML IJ SOLN
30.0000 mg | Freq: Once | INTRAMUSCULAR | Status: DC
  Filled 2014-09-10: qty 1

## 2014-09-10 MED ORDER — NAPROXEN 500 MG PO TABS: 500.0000 mg | ORAL_TABLET | Freq: Two times a day (BID) | ORAL | Status: DC | PRN

## 2014-09-10 MED ORDER — SODIUM CHLORIDE 0.9 % IV BOLUS (SEPSIS)
1000.0000 mL | Freq: Once | INTRAVENOUS | Status: AC
  Administered 2014-09-10: 1000 mL via INTRAVENOUS

## 2014-09-10 NOTE — ED Provider Notes (Signed)
CSN: [id number]     Arrival date & time 09/10/14  1027 History   First MD Initiated Contact with Patient 09/10/14 1122     No chief complaint on file.    (Consider location/radiation/quality/duration/timing/severity/associated sxs/prior Treatment) HPI Comments: Cynthia Li is a 58 y.o. female with a PMHx of IBS, panic attacks, nephrolithiasis, hiatal hernia, and DDD with "pinched nerve", who presents to the ED with complaints of sudden onset left flank pain that began at 8:45 AM. Patient reports that the pain was 9/10 sharp left flank pain that radiated into the left lateral aspect of her abdomen, worsened with movement, and completely resolved after taking 3 ibuprofen tablets. She is no longer experiencing this pain. She said at the time she experienced associated nausea and loose watery stools 2. She reports associated increased urinary frequency and urgency, dysuria, and malodorous urine. She states that one year ago she had an MRI which found a small left kidney stone, and at the time they stated that she did not have any need for urology follow-up. She states that typically if she drinks copious amounts of water she doesn't have any problems, but that today she had not been drinking enough water. Denies fevers, chills, neck/back pain, CP, SOB, vomiting, constipation, obstipation, melena, hematochezia, hematuria, vaginal bleeding or discharge, myalgias, arthralgias, weakness, paresthesias, cauda equina symptoms, or rashes. Denies EtOH use or recent travel/sick contacts.   Patient is a 58 y.o. female presenting with flank pain. The history is provided by the patient. No language interpreter was used.  Flank Pain This is a new problem. The current episode started today. The problem occurs intermittently. The problem has been resolved. Associated symptoms include abdominal pain (radiating from L flank), a change in bowel habit (loose stool this AM), nausea (now resolved) and urinary symptoms  (dysuria, frequency/urgency, malodorous urine). Pertinent negatives include no arthralgias, chest pain, chills, coughing, fever, headaches, myalgias, neck pain, numbness, vertigo, vomiting or weakness. Exacerbated by: movement. She has tried NSAIDs for the symptoms. The treatment provided significant relief.    Past Medical History  Diagnosis Date  . IBS (irritable bowel syndrome)     age 58  . Panic disorder   . Kidney stone 3/14    no surgery  . Hiatal hernia   . Pinched nerve     some DDD   Past Surgical History  Procedure Laterality Date  . Total abdominal hysterectomy  09/2006  . Cholecystectomy  age 58  . Knee arthroscopy      right   Family History  Problem Relation Age of Onset  . Parkinson's disease Father     deceased  . Cancer Father     renal tumor  . Ulcerative colitis Sister   . Heart attack Father   . Alzheimer's disease Father    History  Substance Use Topics  . Smoking status: Never Smoker   . Smokeless tobacco: Never Used  . Alcohol Use: 0.0 oz/week     Comment: occ   OB History    Gravida Para Term Preterm AB TAB SAB Ectopic Multiple Living   2 1 1  1  1   1      Review of Systems  Constitutional: Negative for fever and chills.  Respiratory: Negative for cough and shortness of breath.   Cardiovascular: Negative for chest pain.  Gastrointestinal: Positive for nausea (now resolved), abdominal pain (radiating from L flank), diarrhea (two episodes this AM, loose) and change in bowel habit (loose stool this AM). Negative  for vomiting, constipation, blood in stool and rectal pain.  Genitourinary: Positive for dysuria, urgency, frequency and flank pain (left). Negative for hematuria, vaginal bleeding and vaginal discharge.  Musculoskeletal: Negative for myalgias, back pain, arthralgias and neck pain.  Skin: Negative for color change.  Allergic/Immunologic: Negative for immunocompromised state.  Neurological: Negative for vertigo, weakness,  light-headedness, numbness and headaches.  Hematological: Does not bruise/bleed easily.   10 Systems reviewed and are negative for acute change except as noted in the HPI.    Allergies  Codeine and Penicillins  Home Medications   Prior to Admission medications   Medication Sig Start Date End Date Taking? Authorizing Provider  cholecalciferol (VITAMIN D) 1000 UNITS tablet Take 1,000 Units by mouth daily.    Historical Provider, MD  CHROMIUM PICOLINATE PO Take by mouth.    Historical Provider, MD  Flaxseed, Linseed, (FLAXSEED OIL PO) Take by mouth.    Historical Provider, MD  GINSENG PO Take by mouth.    Historical Provider, MD  Glucosamine HCl (GLUCOSAMINE PO) Take by mouth daily.    Historical Provider, MD  glucosamine-chondroitin 500-400 MG tablet Take 1 tablet by mouth 3 (three) times daily.    Historical Provider, MD  KRILL OIL PO Take by mouth. Omega 3 red krill oil    Historical Provider, MD  Multiple Vitamin (MULTIVITAMIN) capsule Take 1 capsule by mouth daily.    Historical Provider, MD  pantoprazole (PROTONIX) 20 MG tablet Take 40 mg by mouth daily.    Historical Provider, MD  sulfamethoxazole-trimethoprim (BACTRIM DS) 800-160 MG per tablet Take 1 tablet by mouth 2 (two) times daily. 06/01/14   Annamaria Boots, MD  vitamin E 400 UNIT capsule Take 400 Units by mouth daily.    Historical Provider, MD   BP 133/103 mmHg  Pulse 75  Temp(Src) 97.9 F (36.6 C) (Oral)  Resp 17  SpO2 100%  LMP 09/22/2006 Physical Exam  Constitutional: She is oriented to person, place, and time. Vital signs are normal. She appears well-developed and well-nourished.  Non-toxic appearance. No distress.  Afebrile, nontoxic, NAD  HENT:  Head: Normocephalic and atraumatic.  Mouth/Throat: Oropharynx is clear and moist. Mucous membranes are dry (mildly).  Mildly dry mucous membranes  Eyes: Conjunctivae and EOM are normal. Right eye exhibits no discharge. Left eye exhibits no discharge.  Neck: Normal  range of motion. Neck supple.  Cardiovascular: Normal rate, regular rhythm, normal heart sounds and intact distal pulses.  Exam reveals no gallop and no friction rub.   No murmur heard. Pulmonary/Chest: Effort normal and breath sounds normal. No respiratory distress. She has no decreased breath sounds. She has no wheezes. She has no rhonchi. She has no rales.  Abdominal: Soft. Normal appearance and bowel sounds are normal. She exhibits no distension. There is tenderness in the left upper quadrant and left lower quadrant. There is no rigidity, no rebound, no guarding, no CVA tenderness, no tenderness at McBurney's point and negative Murphy's sign.    Soft, obese but ND, +BS throughout, pain in L lateral aspect of abdomen, no r/g/r, neg murphy's, neg mcburney's, no CVA TTP   Musculoskeletal: Normal range of motion.  All spinal levels nonTTP without bony deformities or step offs, no paraspinous muscle TTP Strength and sensation at baseline, distal pulses intact  Neurological: She is alert and oriented to person, place, and time. She has normal strength. No sensory deficit.  Skin: Skin is warm, dry and intact. No rash noted.  Psychiatric: She has a normal mood and  affect.  Nursing note and vitals reviewed.   ED Course  Procedures (including critical care time) Labs Review Labs Reviewed  URINALYSIS, ROUTINE W REFLEX MICROSCOPIC - Abnormal; Notable for the following:    APPearance CLOUDY (*)    Hgb urine dipstick LARGE (*)    Leukocytes, UA SMALL (*)    All other components within normal limits  CBC WITH DIFFERENTIAL - Abnormal; Notable for the following:    Neutrophils Relative % 78 (*)    All other components within normal limits  COMPREHENSIVE METABOLIC PANEL - Abnormal; Notable for the following:    Glucose, Bld 115 (*)    All other components within normal limits  LIPASE, BLOOD  URINE MICROSCOPIC-ADD ON    Imaging Review Ct Renal Stone Study  09/10/2014   CLINICAL DATA:  Left  flank pain  EXAM: CT ABDOMEN AND PELVIS WITHOUT CONTRAST  TECHNIQUE: Multidetector CT imaging of the abdomen and pelvis was performed following the standard protocol without IV contrast.  COMPARISON:  None.  FINDINGS: The lung bases are clear.  There is a 18 x 10 mm left UPJ calculus with mild surrounding inflammatory changes and moderate left hydronephrosis. There is no other renal, ureteral or bladder calculus. The kidneys are symmetric in size without evidence for exophytic mass. The bladder is unremarkable.  The liver demonstrates no focal abnormality. The gallbladder is surgically absent. The spleen demonstrates no focal abnormality. The adrenal glands and pancreas are normal.  The unopacified stomach, duodenum, small intestine and large intestine are unremarkable, but evaluation is limited by lack of oral contrast. There is a normal caliber appendix in the right lower quadrant without periappendiceal inflammatory changes. There is no pneumoperitoneum, pneumatosis, or portal venous gas. There is no abdominal or pelvic free fluid. There is no lymphadenopathy.  The abdominal aorta is normal in caliber .  The osseous structures are unremarkable.Moderate broad-based disc bulge at L4-5. Mild broad-based disc bulge at L3-4. Severe degenerative disc disease at L5-S1. Severe bilateral facet arthropathy at L4-5.  IMPRESSION: 1. There is a 18 x 10 mm left UPJ calculus with mild surrounding inflammatory changes and moderate left hydronephrosis. 2. Normal appendix.   Electronically Signed   By: Elige Ko   On: 09/10/2014 13:07     EKG Interpretation None      MDM   Final diagnoses:  Left flank pain  Nephrolithiasis  Nausea  Dysuria  Hematuria    58 y.o. female with L flank pain x1 day radiating into L lateral abd. Will obtain labs and u/a, and CT. Pt states her pain is gone and she doesn't need anything at this time. Will give fluids now. Will reassess shortly.  1:59 PM Pain beginning to return,  given toradol 30mg  IV now. Nausea continues to be resolved without intervention. Tolerating PO. CBC w/diff unremarkable, CMP WNL, lipase WNL, U/A showing hematuria without evidence of infection. CT showing 1.8x1cm stone in L UPJ with moderate hydronephrosis. Pt's symptoms controlled at this time and good candidate for outpt f/up to schedule procedure for removal. Discussed at length reasons to return, and importance of f/up. Will give pain/nausea meds for home. I explained the diagnosis and have given explicit precautions to return to the ER including for any other new or worsening symptoms. The patient understands and accepts the medical plan as it's been dictated and I have answered their questions. Discharge instructions concerning home care and prescriptions have been given. The patient is STABLE and is discharged to home in good condition.  BP 118/61 mmHg  Pulse 79  Temp(Src) 97.9 F (36.6 C) (Oral)  Resp 20  SpO2 97%  LMP 09/22/2006  Meds ordered this encounter  Medications  . ketorolac (TORADOL) 30 MG/ML injection 30 mg    Sig:   . HYDROcodone-acetaminophen (NORCO) 5-325 MG per tablet    Sig: Take 1 tablet by mouth every 6 (six) hours as needed for severe pain.    Dispense:  10 tablet    Refill:  0    Order Specific Question:  Supervising Provider    Answer:  Eber HongMILLER, BRIAN D [[id number]]  . naproxen (NAPROSYN) 500 MG tablet    Sig: Take 1 tablet (500 mg total) by mouth 2 (two) times daily as needed for mild pain, moderate pain or headache (TAKE WITH MEALS.).    Dispense:  20 tablet    Refill:  0    Order Specific Question:  Supervising Provider    Answer:  Eber HongMILLER, BRIAN D [[id number]]  . ondansetron (ZOFRAN ODT) 4 MG disintegrating tablet    Sig: Take 1 tablet (4 mg total) by mouth every 8 (eight) hours as needed for nausea or vomiting.    Dispense:  15 tablet    Refill:  0    Order Specific Question:  Supervising Provider    Answer:  Vida RollerMILLER, BRIAN D [address][id number]]     Kasim Mccorkle Strupp  Murrayamprubi-Soms, PA-C 09/10/14 1402  Enid SkeensJoshua M Zavitz, MD 09/10/14 1409

## 2014-09-10 NOTE — ED Notes (Signed)
Pt states she had hematuria 1 year ago, found kidney stone on right side. Pt came to ED for exacerbation of left flank pain.

## 2014-09-10 NOTE — ED Notes (Signed)
PA at bedside.

## 2014-09-10 NOTE — Discharge Instructions (Signed)
Take naprosyn as directed as needed for inflammation and pain using vicodin for breakthrough pain. Do not drive or operate machinery with pain medication use. May need over-the-counter stool softener with this pain medication use. Call the urologist tomorrow to schedule an appointment to discuss methods of removal for your large kidney stone, however for intractable or uncontrollable pain at home then return to the emergency department. Use Zofran as needed for nausea. Stay well hydrated.    Kidney Stones Kidney stones (urolithiasis) are solid masses that form inside your kidneys. The intense pain is caused by the stone moving through the kidney, ureter, bladder, and urethra (urinary tract). When the stone moves, the ureter starts to spasm around the stone. The stone is usually passed in your pee (urine).  HOME CARE  Drink enough fluids to keep your pee clear or pale yellow. This helps to get the stone out.  Strain all pee through the provided strainer. Do not pee without peeing through the strainer, not even once. If you pee the stone out, catch it in the strainer. The stone may be as small as a grain of salt. Take this to your doctor. This will help your doctor figure out what you can do to try to prevent more kidney stones.  Only take medicine as told by your doctor.  Follow up with your doctor as told.  Get follow-up X-rays as told by your doctor. GET HELP IF: You have pain that gets worse even if you have been taking pain medicine. GET HELP RIGHT AWAY IF:   Your pain does not get better with medicine.  You have a fever or shaking chills.  Your pain increases and gets worse over 18 hours.  You have new belly (abdominal) pain.  You feel faint or pass out.  You are unable to pee. MAKE SURE YOU:   Understand these instructions.  Will watch your condition.  Will get help right away if you are not doing well or get worse. Document Released: 02/25/2008 Document Revised: 05/11/2013  Document Reviewed: 02/09/2013 [facility] Patient Information 2015 [place], [place]. This information is not intended to replace advice given to you by your health care provider. Make sure you discuss any questions you have with your health care provider.  Low-Purine Diet Purines are compounds that affect the level of uric acid in your body. A low-purine diet is a diet that is low in purines. Eating a low-purine diet can prevent the level of uric acid in your body from getting too high and causing gout or kidney stones or both. WHAT DO I NEED TO KNOW ABOUT THIS DIET?  Choose low-purine foods. Examples of low-purine foods are listed in the next section.  Drink plenty of fluids, especially water. Fluids can help remove uric acid from your body. Try to drink 8-16 cups (1.9-3.8 L) a day.  Limit foods high in fat, especially saturated fat, as fat makes it harder for the body to get rid of uric acid. Foods high in saturated fat include pizza, cheese, ice cream, whole milk, fried foods, and gravies. Choose foods that are lower in fat and lean sources of protein. Use olive oil when cooking as it contains healthy fats that are not high in saturated fat.  Limit alcohol. Alcohol interferes with the elimination of uric acid from your body. If you are having a gout attack, avoid all alcohol.  Keep in mind that different people's bodies react differently to different foods. You will probably learn over time which foods  do or do not affect you. If you discover that a food tends to cause your gout to flare up, avoid eating that food. You can more freely enjoy foods that do not cause problems. If you have any questions about a food item, talk to your dietitian or health care provider. WHICH FOODS ARE LOW, MODERATE, AND HIGH IN PURINES? The following is a list of foods that are low, moderate, and high in purines. You can eat any amount of the foods that are low in purines. You may be able to have small amounts of foods  that are moderate in purines. Ask your health care provider how much of a food moderate in purines you can have. Avoid foods high in purines. Grains  Foods low in purines: Enriched white bread, pasta, rice, cake, cornbread, popcorn.  Foods moderate in purines: Whole-grain breads and cereals, wheat germ, bran, oatmeal. Uncooked oatmeal. Dry wheat bran or wheat germ.  Foods high in purines: Pancakes, [place] toast, biscuits, muffins. Vegetables  Foods low in purines: All vegetables, except those that are moderate in purines.  Foods moderate in purines: Asparagus, cauliflower, spinach, mushrooms, green peas. Fruits  All fruits are low in purines. Meats and other Protein Foods  Foods low in purines: Eggs, nuts, peanut butter.  Foods moderate in purines: 80-90% lean beef, lamb, veal, pork, poultry, fish, eggs, peanut butter, nuts. Crab, lobster, oysters, and shrimp. Cooked dried beans, peas, and lentils.  Foods high in purines: Anchovies, sardines, herring, mussels, tuna, codfish, scallops, trout, and haddock. Tomasa BlaseBacon. Organ meats (such as liver or kidney). Tripe. Game meat. Goose. Sweetbreads. Dairy  All dairy foods are low in purines. Low-fat and fat-free dairy products are best because they are low in saturated fat. Beverages  Drinks low in purines: Water, carbonated beverages, tea, coffee, cocoa.  Drinks moderate in purines: Soft drinks and other drinks sweetened with high-fructose corn syrup. Juices. To find whether a food or drink is sweetened with high-fructose corn syrup, look at the ingredients list.  Drinks high in purines: Alcoholic beverages (such as beer). Condiments  Foods low in purines: Salt, herbs, olives, pickles, relishes, vinegar.  Foods moderate in purines: Butter, margarine, oils, mayonnaise. Fats and Oils  Foods low in purines: All types, except gravies and sauces made with meat.  Foods high in purines: Gravies and sauces made with meat. Other Foods  Foods  low in purines: Sugars, sweets, gelatin. Cake. Soups made without meat.  Foods moderate in purines: Meat-based or fish-based soups, broths, or bouillons. Foods and drinks sweetened with high-fructose corn syrup.  Foods high in purines: High-fat desserts (such as ice cream, cookies, cakes, pies, doughnuts, and chocolate). Contact your dietitian for more information on foods that are not listed here. Document Released: 01/03/2011 Document Revised: 09/13/2013 Document Reviewed: 08/15/2013 [facility] Patient Information 2015 [place], [place]. This information is not intended to replace advice given to you by your health care provider. Make sure you discuss any questions you have with your health care provider.

## 2014-09-19 ENCOUNTER — Other Ambulatory Visit: Payer: Self-pay | Admitting: Urology

## 2014-10-09 NOTE — Patient Instructions (Addendum)
Cynthia Li  10/09/2014   Your procedure is scheduled on: 10/13/2014    [facility] and follow signs to               Short Stay Center at     0530 AM.  Call this number if you have problems the morning of surgery [phone]   Remember:  Do not eat food or drink liquids :After Midnight.     Take these medicines the morning of surgery with A SIP OF WATER: Prilosec                               You may not have any metal on your body including hair pins and              piercings  Do not wear jewelry, make-up, lotions, powders or perfumes., deodorant.               Do not wear nail polish.  Do not shave  48 hours prior to surgery.     Do not bring valuables to the hospital. [facility] IS NOT             RESPONSIBLE   FOR VALUABLES.  Contacts, dentures or bridgework may not be worn into surgery.  Leave suitcase in the car. After surgery it may be brought to your room.   Marland Kitchen.    Special Instructions: coughing and deep breathing exercises, leg exercises               Please read over the following fact sheets you were given: _____________________________________________________________________             [facility] Pre[facility] Before surgery, you can play an important role.  Because skin is not sterile, your skin needs to be as free of germs as possible.  You can reduce the number of germs on your skin by washing with CHG (chlorahexidine gluconate) soap before surgery.  CHG is an antiseptic cleaner which kills germs and bonds with the skin to continue killing germs even after washing. Please DO NOT use if you have an allergy to CHG or antibacterial soaps.  If your skin becomes reddened/irritated stop using the CHG and inform your nurse when you arrive at Short Stay. Do not shave (including legs and underarms) for at least 48 hours prior to the first CHG shower.  You may shave your face/neck. Please follow these  instructions carefully:  1.  Shower with CHG Soap the night before surgery and the  morning of Surgery.  2.  If you choose to wash your hair, wash your hair first as usual with your  normal  shampoo.  3.  After you shampoo, rinse your hair and body thoroughly to remove the  shampoo.                           4.  Use CHG as you would any other liquid soap.  You can apply chg directly  to the skin and wash                       Gently with a scrungie or clean washcloth.  5.  Apply the CHG Soap to your body ONLY FROM THE NECK DOWN.  Do not use on face/ open                           Wound or open sores. Avoid contact with eyes, ears mouth and genitals (private parts).                       Wash face,  Genitals (private parts) with your normal soap.             6.  Wash thoroughly, paying special attention to the area where your surgery  will be performed.  7.  Thoroughly rinse your body with warm water from the neck down.  8.  DO NOT shower/wash with your normal soap after using and rinsing off  the CHG Soap.                9.  Pat yourself dry with a clean towel.            10.  Wear clean pajamas.            11.  Place clean sheets on your bed the night of your first shower and do not  sleep with pets. Day of Surgery : Do not apply any lotions/deodorants the morning of surgery.  Please wear clean clothes to the hospital/surgery center.  FAILURE TO FOLLOW THESE INSTRUCTIONS MAY RESULT IN THE CANCELLATION OF YOUR SURGERY PATIENT SIGNATURE_________________________________  NURSE SIGNATURE__________________________________  ________________________________________________________________________

## 2014-10-10 ENCOUNTER — Encounter (HOSPITAL_COMMUNITY): Payer: Self-pay

## 2014-10-10 ENCOUNTER — Encounter (HOSPITAL_COMMUNITY)
Admission: RE | Admit: 2014-10-10 | Discharge: 2014-10-10 | Disposition: A | Discharge status: Home / Self Care | Payer: BC Managed Care – PPO | Source: Ambulatory Visit | Attending: Urology | Admitting: Urology

## 2014-10-10 DIAGNOSIS — N200 Calculus of kidney: Secondary | ICD-10-CM | POA: Diagnosis present

## 2014-10-10 DIAGNOSIS — E119 Type 2 diabetes mellitus without complications: Secondary | ICD-10-CM | POA: Diagnosis not present

## 2014-10-10 DIAGNOSIS — K219 Gastro-esophageal reflux disease without esophagitis: Secondary | ICD-10-CM | POA: Diagnosis not present

## 2014-10-10 DIAGNOSIS — F410 Panic disorder [episodic paroxysmal anxiety] without agoraphobia: Secondary | ICD-10-CM | POA: Diagnosis not present

## 2014-10-10 DIAGNOSIS — Z880 Allergy status to penicillin: Secondary | ICD-10-CM | POA: Diagnosis not present

## 2014-10-10 DIAGNOSIS — F1099 Alcohol use, unspecified with unspecified alcohol-induced disorder: Secondary | ICD-10-CM | POA: Diagnosis not present

## 2014-10-10 DIAGNOSIS — Z885 Allergy status to narcotic agent status: Secondary | ICD-10-CM | POA: Diagnosis not present

## 2014-10-10 DIAGNOSIS — Z79899 Other long term (current) drug therapy: Secondary | ICD-10-CM | POA: Diagnosis not present

## 2014-10-10 DIAGNOSIS — M1990 Unspecified osteoarthritis, unspecified site: Secondary | ICD-10-CM | POA: Diagnosis not present

## 2014-10-10 DIAGNOSIS — Z6834 Body mass index (BMI) 34.0-34.9, adult: Secondary | ICD-10-CM | POA: Diagnosis not present

## 2014-10-10 DIAGNOSIS — E669 Obesity, unspecified: Secondary | ICD-10-CM | POA: Diagnosis not present

## 2014-10-10 HISTORY — DX: Adverse effect of unspecified anesthetic, initial encounter: T41.45XA

## 2014-10-10 HISTORY — DX: Family history of other specified conditions: Z84.89

## 2014-10-10 HISTORY — DX: Unspecified thoracic, thoracolumbar and lumbosacral intervertebral disc disorder: M51.9

## 2014-10-10 HISTORY — DX: Unspecified osteoarthritis, unspecified site: M19.90

## 2014-10-10 HISTORY — DX: Gastro-esophageal reflux disease without esophagitis: K21.9

## 2014-10-10 LAB — CBC
HCT: 41.9 % (ref 36.0–46.0)
HEMOGLOBIN: 13.5 g/dL (ref 12.0–15.0)
MCH: 29.5 pg (ref 26.0–34.0)
MCHC: 32.2 g/dL (ref 30.0–36.0)
MCV: 91.7 fL (ref 78.0–100.0)
PLATELETS: 275 10*3/uL (ref 150–400)
RBC: 4.57 MIL/uL (ref 3.87–5.11)
RDW: 12.4 % (ref 11.5–15.5)
WBC: 5.9 10*3/uL (ref 4.0–10.5)

## 2014-10-10 LAB — BASIC METABOLIC PANEL
Anion gap: 8 (ref 5–15)
BUN: 16 mg/dL (ref 6–23)
CALCIUM: 9.9 mg/dL (ref 8.4–10.5)
CO2: 29 mmol/L (ref 19–32)
CREATININE: 0.73 mg/dL (ref 0.50–1.10)
Chloride: 103 mEq/L (ref 96–112)
GFR calc non Af Amer: >90 mL/min (ref >90)
Glucose, Bld: 98 mg/dL (ref 70–99)
POTASSIUM: 4.2 mmol/L (ref 3.5–5.1)
Sodium: 140 mmol/L (ref 135–145)

## 2014-10-10 NOTE — Progress Notes (Signed)
Patient to call back with complete list of vitamins taken in last 30 days.

## 2014-10-12 MED ORDER — GENTAMICIN SULFATE 40 MG/ML IJ SOLN
5.0000 mg/kg | Freq: Once | INTRAVENOUS | Status: AC
  Administered 2014-10-13: 370 mg via INTRAVENOUS
  Filled 2014-10-12: qty 9.25

## 2014-10-13 ENCOUNTER — Encounter (HOSPITAL_COMMUNITY): Payer: Self-pay | Admitting: Anesthesiology

## 2014-10-13 ENCOUNTER — Observation Stay (HOSPITAL_COMMUNITY)
Admission: RE | Admit: 2014-10-13 | Discharge: 2014-10-14 | Disposition: A | Discharge status: Home / Self Care | Payer: BC Managed Care – PPO | Source: Ambulatory Visit | Attending: Urology | Admitting: Urology

## 2014-10-13 ENCOUNTER — Ambulatory Visit (HOSPITAL_COMMUNITY): Payer: BC Managed Care – PPO

## 2014-10-13 ENCOUNTER — Ambulatory Visit (HOSPITAL_COMMUNITY): Payer: BC Managed Care – PPO | Admitting: Anesthesiology

## 2014-10-13 ENCOUNTER — Encounter (HOSPITAL_COMMUNITY)
Admission: RE | Disposition: A | Discharge status: Home / Self Care | Payer: Self-pay | Source: Ambulatory Visit | Attending: Urology

## 2014-10-13 DIAGNOSIS — N200 Calculus of kidney: Principal | ICD-10-CM

## 2014-10-13 DIAGNOSIS — E119 Type 2 diabetes mellitus without complications: Secondary | ICD-10-CM | POA: Insufficient documentation

## 2014-10-13 DIAGNOSIS — Z6834 Body mass index (BMI) 34.0-34.9, adult: Secondary | ICD-10-CM | POA: Insufficient documentation

## 2014-10-13 DIAGNOSIS — M1990 Unspecified osteoarthritis, unspecified site: Secondary | ICD-10-CM | POA: Insufficient documentation

## 2014-10-13 DIAGNOSIS — Z79899 Other long term (current) drug therapy: Secondary | ICD-10-CM | POA: Insufficient documentation

## 2014-10-13 DIAGNOSIS — Z880 Allergy status to penicillin: Secondary | ICD-10-CM | POA: Insufficient documentation

## 2014-10-13 DIAGNOSIS — Z419 Encounter for procedure for purposes other than remedying health state, unspecified: Secondary | ICD-10-CM

## 2014-10-13 DIAGNOSIS — E669 Obesity, unspecified: Secondary | ICD-10-CM | POA: Insufficient documentation

## 2014-10-13 DIAGNOSIS — K219 Gastro-esophageal reflux disease without esophagitis: Secondary | ICD-10-CM | POA: Insufficient documentation

## 2014-10-13 DIAGNOSIS — F1099 Alcohol use, unspecified with unspecified alcohol-induced disorder: Secondary | ICD-10-CM | POA: Insufficient documentation

## 2014-10-13 DIAGNOSIS — Z885 Allergy status to narcotic agent status: Secondary | ICD-10-CM | POA: Insufficient documentation

## 2014-10-13 DIAGNOSIS — F410 Panic disorder [episodic paroxysmal anxiety] without agoraphobia: Secondary | ICD-10-CM | POA: Insufficient documentation

## 2014-10-13 HISTORY — PX: CYSTOSCOPY W/ URETERAL STENT PLACEMENT: SHX1429

## 2014-10-13 HISTORY — PX: NEPHROLITHOTOMY: SHX5134

## 2014-10-13 HISTORY — PX: HOLMIUM LASER APPLICATION: SHX5852

## 2014-10-13 LAB — HEMOGLOBIN AND HEMATOCRIT, BLOOD
HCT: 39.4 % (ref 36.0–46.0)
Hemoglobin: 13.4 g/dL (ref 12.0–15.0)

## 2014-10-13 LAB — BASIC METABOLIC PANEL
ANION GAP: 8 (ref 5–15)
BUN: 14 mg/dL (ref 6–23)
CO2: 25 mmol/L (ref 19–32)
Calcium: 9.1 mg/dL (ref 8.4–10.5)
Chloride: 103 mmol/L (ref 96–112)
Creatinine, Ser: 0.99 mg/dL (ref 0.50–1.10)
GFR, EST AFRICAN AMERICAN: 70 mL/min — AB (ref >90)
GFR, EST NON AFRICAN AMERICAN: 61 mL/min — AB (ref >90)
Glucose, Bld: 161 mg/dL — ABNORMAL HIGH (ref 70–99)
Potassium: 4 mmol/L (ref 3.5–5.1)
SODIUM: 136 mmol/L (ref 135–145)

## 2014-10-13 SURGERY — NEPHROLITHOTOMY PERCUTANEOUS
Anesthesia: General | Laterality: Left

## 2014-10-13 MED ORDER — SENNOSIDES-DOCUSATE SODIUM 8.6-50 MG PO TABS: 1.0000 | ORAL_TABLET | Freq: Two times a day (BID) | ORAL | Status: DC

## 2014-10-13 MED ORDER — LACTATED RINGERS IV SOLN
INTRAVENOUS | Status: DC | PRN
Start: 2014-10-13 — End: 2014-10-13
  Administered 2014-10-13 (×2): via INTRAVENOUS

## 2014-10-13 MED ORDER — HYDROMORPHONE HCL 1 MG/ML IJ SOLN
INTRAMUSCULAR | Status: DC | PRN
  Administered 2014-10-13 (×5): .4 mg via INTRAVENOUS

## 2014-10-13 MED ORDER — PROPOFOL 10 MG/ML IV BOLUS
INTRAVENOUS | Status: DC | PRN
  Administered 2014-10-13: 180 mg via INTRAVENOUS

## 2014-10-13 MED ORDER — HYDROMORPHONE HCL 2 MG/ML IJ SOLN
INTRAMUSCULAR | Status: AC
  Filled 2014-10-13: qty 1

## 2014-10-13 MED ORDER — LIDOCAINE HCL (CARDIAC) 20 MG/ML IV SOLN
INTRAVENOUS | Status: AC
  Filled 2014-10-13: qty 5

## 2014-10-13 MED ORDER — HYDROMORPHONE HCL 1 MG/ML IJ SOLN: 0.2500 mg | INTRAMUSCULAR | Status: DC | PRN

## 2014-10-13 MED ORDER — SENNA 8.6 MG PO TABS
1.0000 | ORAL_TABLET | Freq: Two times a day (BID) | ORAL | Status: DC
  Administered 2014-10-13: 8.6 mg via ORAL
  Filled 2014-10-13: qty 1

## 2014-10-13 MED ORDER — SUCCINYLCHOLINE CHLORIDE 20 MG/ML IJ SOLN
INTRAMUSCULAR | Status: DC | PRN
  Administered 2014-10-13: 100 mg via INTRAVENOUS

## 2014-10-13 MED ORDER — SODIUM CHLORIDE 0.9 % IJ SOLN
INTRAMUSCULAR | Status: AC
  Filled 2014-10-13: qty 10

## 2014-10-13 MED ORDER — CISATRACURIUM BESYLATE (PF) 10 MG/5ML IV SOLN
INTRAVENOUS | Status: DC | PRN
  Administered 2014-10-13: 6 mg via INTRAVENOUS
  Administered 2014-10-13: 2 mg via INTRAVENOUS
  Administered 2014-10-13: 12 mg via INTRAVENOUS

## 2014-10-13 MED ORDER — OXYCODONE HCL 5 MG PO TABS
5.0000 mg | ORAL_TABLET | ORAL | Status: DC | PRN
  Administered 2014-10-13 – 2014-10-14 (×3): 5 mg via ORAL
  Filled 2014-10-13 (×4): qty 1

## 2014-10-13 MED ORDER — ONDANSETRON HCL 4 MG/2ML IJ SOLN: 4.0000 mg | INTRAMUSCULAR | Status: DC | PRN

## 2014-10-13 MED ORDER — HYDROMORPHONE HCL 1 MG/ML IJ SOLN: 0.5000 mg | INTRAMUSCULAR | Status: DC | PRN

## 2014-10-13 MED ORDER — LIDOCAINE HCL (CARDIAC) 20 MG/ML IV SOLN
INTRAVENOUS | Status: DC | PRN
  Administered 2014-10-13: 100 mg via INTRAVENOUS

## 2014-10-13 MED ORDER — ONDANSETRON HCL 4 MG/2ML IJ SOLN
INTRAMUSCULAR | Status: AC
  Filled 2014-10-13: qty 2

## 2014-10-13 MED ORDER — SULFAMETHOXAZOLE-TRIMETHOPRIM 800-160 MG PO TABS: 1.0000 | ORAL_TABLET | Freq: Two times a day (BID) | ORAL | Status: DC

## 2014-10-13 MED ORDER — DEXAMETHASONE SODIUM PHOSPHATE 10 MG/ML IJ SOLN
INTRAMUSCULAR | Status: AC
  Filled 2014-10-13: qty 1

## 2014-10-13 MED ORDER — ONDANSETRON HCL 4 MG/2ML IJ SOLN
INTRAMUSCULAR | Status: DC | PRN
  Administered 2014-10-13: 4 mg via INTRAVENOUS

## 2014-10-13 MED ORDER — GLYCOPYRROLATE 0.2 MG/ML IJ SOLN
INTRAMUSCULAR | Status: DC | PRN
  Administered 2014-10-13: 0.6 mg via INTRAVENOUS

## 2014-10-13 MED ORDER — SUFENTANIL CITRATE 50 MCG/ML IV SOLN
INTRAVENOUS | Status: AC
  Filled 2014-10-13: qty 1

## 2014-10-13 MED ORDER — NEOSTIGMINE METHYLSULFATE 10 MG/10ML IV SOLN: INTRAVENOUS | Status: DC | PRN

## 2014-10-13 MED ORDER — MIDAZOLAM HCL 2 MG/2ML IJ SOLN
INTRAMUSCULAR | Status: AC
  Filled 2014-10-13: qty 2

## 2014-10-13 MED ORDER — SODIUM CHLORIDE 0.9 % IR SOLN
Status: DC | PRN
  Administered 2014-10-13: 15000 mL

## 2014-10-13 MED ORDER — SULFAMETHOXAZOLE-TRIMETHOPRIM 800-160 MG PO TABS
1.0000 | ORAL_TABLET | Freq: Two times a day (BID) | ORAL | Status: DC
  Administered 2014-10-13 – 2014-10-14 (×3): 1 via ORAL
  Filled 2014-10-13 (×4): qty 1

## 2014-10-13 MED ORDER — EPHEDRINE SULFATE 50 MG/ML IJ SOLN
INTRAMUSCULAR | Status: AC
  Filled 2014-10-13: qty 1

## 2014-10-13 MED ORDER — MIDAZOLAM HCL 5 MG/5ML IJ SOLN
INTRAMUSCULAR | Status: DC | PRN
  Administered 2014-10-13 (×2): 1 mg via INTRAVENOUS

## 2014-10-13 MED ORDER — DOCUSATE SODIUM 100 MG PO CAPS
100.0000 mg | ORAL_CAPSULE | Freq: Two times a day (BID) | ORAL | Status: DC
  Administered 2014-10-13 – 2014-10-14 (×2): 100 mg via ORAL
  Filled 2014-10-13 (×3): qty 1

## 2014-10-13 MED ORDER — SUFENTANIL CITRATE 50 MCG/ML IV SOLN
INTRAVENOUS | Status: DC | PRN
  Administered 2014-10-13: 10 ug via INTRAVENOUS
  Administered 2014-10-13: 20 ug via INTRAVENOUS
  Administered 2014-10-13 (×2): 10 ug via INTRAVENOUS

## 2014-10-13 MED ORDER — KCL IN DEXTROSE-NACL 20-5-0.45 MEQ/L-%-% IV SOLN
INTRAVENOUS | Status: DC
Start: 2014-10-13 — End: 2014-10-14
  Administered 2014-10-13 (×2): via INTRAVENOUS
  Filled 2014-10-13 (×2): qty 1000

## 2014-10-13 MED ORDER — DEXAMETHASONE SODIUM PHOSPHATE 10 MG/ML IJ SOLN
INTRAMUSCULAR | Status: DC | PRN
  Administered 2014-10-13: 10 mg via INTRAVENOUS

## 2014-10-13 MED ORDER — PROMETHAZINE HCL 25 MG/ML IJ SOLN: 6.2500 mg | INTRAMUSCULAR | Status: DC | PRN

## 2014-10-13 MED ORDER — PANTOPRAZOLE SODIUM 40 MG PO TBEC
40.0000 mg | DELAYED_RELEASE_TABLET | Freq: Every day | ORAL | Status: DC
  Administered 2014-10-14: 40 mg via ORAL
  Filled 2014-10-13: qty 1

## 2014-10-13 MED ORDER — PROPOFOL 10 MG/ML IV BOLUS
INTRAVENOUS | Status: AC
  Filled 2014-10-13: qty 20

## 2014-10-13 MED ORDER — ACETAMINOPHEN 10 MG/ML IV SOLN
1000.0000 mg | Freq: Once | INTRAVENOUS | Status: AC
  Administered 2014-10-13: 1000 mg via INTRAVENOUS
  Filled 2014-10-13: qty 100

## 2014-10-13 MED ORDER — KCL IN DEXTROSE-NACL 20-5-0.45 MEQ/L-%-% IV SOLN
INTRAVENOUS | Status: AC
  Filled 2014-10-13: qty 1000

## 2014-10-13 MED ORDER — NEOSTIGMINE METHYLSULFATE 10 MG/10ML IV SOLN
INTRAVENOUS | Status: DC | PRN
  Administered 2014-10-13: 4 mg via INTRAMUSCULAR

## 2014-10-13 MED ORDER — IOHEXOL 300 MG/ML  SOLN
INTRAMUSCULAR | Status: DC | PRN
  Administered 2014-10-13: 70 mL

## 2014-10-13 MED ORDER — ACETAMINOPHEN 500 MG PO TABS
1000.0000 mg | ORAL_TABLET | Freq: Three times a day (TID) | ORAL | Status: AC
  Administered 2014-10-13 – 2014-10-14 (×3): 1000 mg via ORAL
  Filled 2014-10-13 (×3): qty 2

## 2014-10-13 MED ORDER — OXYCODONE-ACETAMINOPHEN 5-325 MG PO TABS: 1.0000 | ORAL_TABLET | Freq: Four times a day (QID) | ORAL | Status: DC | PRN

## 2014-10-13 MED ORDER — CISATRACURIUM BESYLATE 20 MG/10ML IV SOLN
INTRAVENOUS | Status: AC
  Filled 2014-10-13: qty 10

## 2014-10-13 SURGICAL SUPPLY — 68 items
APL SKNCLS STERI-STRIP NONHPOA (GAUZE/BANDAGES/DRESSINGS) ×1
BAG URINE DRAINAGE (UROLOGICAL SUPPLIES) ×4 IMPLANT
BASKET LASER NITINOL 1.9FR (BASKET) ×1 IMPLANT
BASKET ZERO TIP NITINOL 2.4FR (BASKET) ×1 IMPLANT
BENZOIN TINCTURE PRP APPL 2/3 (GAUZE/BANDAGES/DRESSINGS) ×3 IMPLANT
BLADE SURG 15 STRL LF DISP TIS (BLADE) ×1 IMPLANT
BLADE SURG 15 STRL SS (BLADE) ×2
BSKT STON RTRVL 120 1.9FR (BASKET) ×1
BSKT STON RTRVL ZERO TP 2.4FR (BASKET)
CATCHER STONE W/TUBE ADAPTER (UROLOGICAL SUPPLIES) ×1 IMPLANT
CATH BEACON 5.038 65CM KMP-01 (CATHETERS) ×3 IMPLANT
CATH DILATION NEPHR BALL 15X10 (CATHETERS) ×1 IMPLANT
CATH FOLEY 2W COUNCIL 20FR 5CC (CATHETERS) IMPLANT
CATH FOLEY 2WAY SLVR  5CC 16FR (CATHETERS) ×1
CATH FOLEY 2WAY SLVR 5CC 16FR (CATHETERS) ×1 IMPLANT
CATH INTERMIT  6FR 70CM (CATHETERS) ×2 IMPLANT
CATH ROBINSON RED A/P 20FR (CATHETERS) IMPLANT
CATH X-FORCE N30 NEPHROSTOMY (TUBING) ×2 IMPLANT
CHLORAPREP W/TINT 26ML (MISCELLANEOUS) ×3 IMPLANT
COVER MAYO STAND STRL (DRAPES) ×1 IMPLANT
COVER SURGICAL LIGHT HANDLE (MISCELLANEOUS) ×2 IMPLANT
DRAPE C-ARM 42X120 X-RAY (DRAPES) ×2 IMPLANT
DRAPE CAMERA CLOSED 9X96 (DRAPES) ×4 IMPLANT
DRAPE LINGEMAN PERC (DRAPES) ×2 IMPLANT
DRAPE SHEET LG 3/4 BI-LAMINATE (DRAPES) ×3 IMPLANT
DRAPE SURG IRRIG POUCH 19X23 (DRAPES) ×2 IMPLANT
DRSG PAD ABDOMINAL 8X10 ST (GAUZE/BANDAGES/DRESSINGS) ×4 IMPLANT
DRSG TEGADERM 8X12 (GAUZE/BANDAGES/DRESSINGS) ×4 IMPLANT
FIBER LASER FLEXIVA 1000 (UROLOGICAL SUPPLIES) IMPLANT
FIBER LASER FLEXIVA 200 (UROLOGICAL SUPPLIES) IMPLANT
FIBER LASER FLEXIVA 365 (UROLOGICAL SUPPLIES) ×1 IMPLANT
FIBER LASER FLEXIVA 550 (UROLOGICAL SUPPLIES) IMPLANT
FIBER LASER TRAC TIP (UROLOGICAL SUPPLIES) IMPLANT
GAUZE SPONGE 4X4 12PLY STRL (GAUZE/BANDAGES/DRESSINGS) ×2 IMPLANT
GLOVE BIOGEL M STRL SZ7.5 (GLOVE) ×11 IMPLANT
GOWN STRL REUS W/TWL XL LVL3 (GOWN DISPOSABLE) ×6 IMPLANT
GUIDEWIRE AMPLAZ .035X145 (WIRE) ×6 IMPLANT
GUIDEWIRE ANG ZIPWIRE 038X150 (WIRE) ×3 IMPLANT
GUIDEWIRE STR DUAL SENSOR (WIRE) ×1 IMPLANT
KIT BASIN OR (CUSTOM PROCEDURE TRAY) ×1 IMPLANT
MANIFOLD NEPTUNE II (INSTRUMENTS) ×2 IMPLANT
NDL TROCAR 18X15 ECHO (NEEDLE) IMPLANT
NDL TROCAR 18X20 (NEEDLE) IMPLANT
NEEDLE TROCAR 18X15 ECHO (NEEDLE) IMPLANT
NEEDLE TROCAR 18X20 (NEEDLE) ×2 IMPLANT
NS IRRIG 1000ML POUR BTL (IV SOLUTION) ×2 IMPLANT
PACK BASIC VI WITH GOWN DISP (CUSTOM PROCEDURE TRAY) ×2 IMPLANT
PACK CYSTO (CUSTOM PROCEDURE TRAY) ×2 IMPLANT
PROBE LITHOCLAST ULTRA 3.8X403 (UROLOGICAL SUPPLIES) IMPLANT
PROBE PNEUMATIC 1.0MMX570MM (UROLOGICAL SUPPLIES) ×1 IMPLANT
SET IRRIG Y TYPE TUR BLADDER L (SET/KITS/TRAYS/PACK) ×2 IMPLANT
SET WARMING FLUID IRRIGATION (MISCELLANEOUS) IMPLANT
SHEATH PEELAWAY SET 9 (SHEATH) ×3 IMPLANT
SHIELD EYE BINOCULAR (MISCELLANEOUS) IMPLANT
SPONGE LAP 4X18 X RAY DECT (DISPOSABLE) ×2 IMPLANT
STENT CONTOUR 6FRX24X.038 (STENTS) ×1 IMPLANT
STONE CATCHER W/TUBE ADAPTER (UROLOGICAL SUPPLIES) IMPLANT
SURGIFLO W/THROMBIN 8M KIT (HEMOSTASIS) ×1 IMPLANT
SUT SILK 2 0 30  PSL (SUTURE)
SUT SILK 2 0 30 PSL (SUTURE) ×1 IMPLANT
SUT SILK 2 0 SH (SUTURE) ×1 IMPLANT
SUT VIC AB 3-0 SH 27 (SUTURE) ×2
SUT VIC AB 3-0 SH 27X BRD (SUTURE) IMPLANT
SYR 20CC LL (SYRINGE) ×4 IMPLANT
SYRINGE 10CC LL (SYRINGE) ×2 IMPLANT
TOWEL OR 17X26 10 PK STRL BLUE (TOWEL DISPOSABLE) ×2 IMPLANT
TUBING CONNECTING 10 (TUBING) ×5 IMPLANT
WATER STERILE IRR 1500ML POUR (IV SOLUTION) ×1 IMPLANT

## 2014-10-13 NOTE — H&P (Signed)
Cynthia Li is an 59 y.o. female.    Chief Complaint: Pre-OP Left Percutaneous Nephrostolithotomy  HPI:   1 - Large Left Renal Stone - approx 2cm left UPJ stone with mild-mod hydro by ER CT 08/2014 on eval colicky flank pain (SSD 14cm, 800HU). . No additional stones.   PMH sig for obesity, DM2, Appy, TAH (benign). Her PCP is Dr. Ballard Russell.  Today "[name]" is seen to proceed with left percutaneous nephrostolithotomy. She has had some bacteruria w/o fevers on / off and been on proph ABX according to cultures. Most recent CX non-specific low growth, before that negative.   Past Medical History  Diagnosis Date  . IBS (irritable bowel syndrome)     age 21  . Panic disorder   . Kidney stone 3/14    no surgery  . Hiatal hernia   . Pinched nerve     some DDD  . Family history of adverse reaction to anesthesia     father- slow to wake up   . Complication of anesthesia     once when teeth were numb made heart race   . GERD (gastroesophageal reflux disease)   . Arthritis   . Lumbar disc disease     Past Surgical History  Procedure Laterality Date  . Total abdominal hysterectomy  09/2006  . Cholecystectomy  age 67  . Knee arthroscopy      right    Family History  Problem Relation Age of Onset  . Parkinson's disease Father     deceased  . Cancer Father     renal tumor  . Ulcerative colitis Sister   . Heart attack Father   . Alzheimer's disease Father    Social History:  reports that she has never smoked. She has never used smokeless tobacco. She reports that she drinks alcohol. She reports that she does not use illicit drugs.  Allergies:  Allergies  Allergen Reactions  . Codeine     headaches  . Penicillins Hives    Medications Prior to Admission  Medication Sig Dispense Refill  . HYDROcodone-acetaminophen (NORCO) 5-325 MG per tablet Take 1 tablet by mouth every 6 (six) hours as needed for severe pain. 10 tablet 0  . ibuprofen (ADVIL,MOTRIN) 200 MG tablet Take  200-600 mg by mouth every 6 (six) hours as needed for moderate pain.    Marland Kitchen omeprazole (PRILOSEC) 40 MG capsule Take 40 mg by mouth daily.    . ondansetron (ZOFRAN ODT) 4 MG disintegrating tablet Take 1 tablet (4 mg total) by mouth every 8 (eight) hours as needed for nausea or vomiting. 15 tablet 0  . sulfamethoxazole-trimethoprim (BACTRIM DS,SEPTRA DS) 800-160 MG per tablet Take 1 tablet by mouth 2 (two) times daily.    . cholecalciferol (VITAMIN D) 1000 UNITS tablet Take 1,000 Units by mouth daily.    . Cyanocobalamin (VITAMIN B 12 PO) Take 5,000 mcg by mouth daily.     . fluticasone (FLONASE) 50 MCG/ACT nasal spray Place 1 spray into both nostrils daily.    . Multiple Vitamins-Minerals (MULTIVITAMIN WITH MINERALS) tablet Take 1 tablet by mouth daily.    . naproxen (NAPROSYN) 500 MG tablet Take 1 tablet (500 mg total) by mouth 2 (two) times daily as needed for mild pain, moderate pain or headache (TAKE WITH MEALS.). 20 tablet 0  . Omega-3 Fatty Acids (FISH OIL) 1000 MG CAPS Take 1 capsule by mouth daily.    Marland Kitchen OVER THE COUNTER MEDICATION Omega Red Super Heart- 1 tablet daily    .  OVER THE COUNTER MEDICATION Move Free Joint Health - 1 tablet daily    . oxyCODONE-acetaminophen (PERCOCET/ROXICET) 5-325 MG per tablet Take 1 tablet by mouth every 6 (six) hours as needed for severe pain (Pain).    Marland Kitchen. sulfamethoxazole-trimethoprim (BACTRIM DS) 800-160 MG per tablet Take 1 tablet by mouth 2 (two) times daily. (Patient not taking: Reported on 09/10/2014) 10 tablet 0  . vitamin E 400 UNIT capsule Take 400 Units by mouth daily.      No results found for this or any previous visit (from the past 48 hour(s)). No results found.  Review of Systems  Constitutional: Negative.  Negative for fever and chills.  HENT: Negative.   Eyes: Negative.   Respiratory: Negative.   Cardiovascular: Negative.   Gastrointestinal: Negative.   Genitourinary: Positive for flank pain.  Musculoskeletal: Negative.   Skin:  Negative.   Neurological: Negative.   Endo/Heme/Allergies: Negative.   Psychiatric/Behavioral: Negative.     Blood pressure 141/79, pulse 100, temperature 98.6 F (37 C), temperature source Oral, resp. rate 18, height 5\' 6"  (1.676 m), weight 95.709 kg (211 lb), last menstrual period 09/22/2006, SpO2 100 %. Physical Exam  Constitutional: She appears well-developed.  HENT:  Head: Normocephalic.  Eyes: Pupils are equal, round, and reactive to light.  Neck: Normal range of motion.  Cardiovascular: Normal rate.   Respiratory: Effort normal.  GI: Soft.  Genitourinary:  Very mild Lt CVAT  Musculoskeletal: Normal range of motion.  Neurological: She is alert.  Skin: Skin is warm.  Psychiatric: She has a normal mood and affect. Her behavior is normal. Judgment and thought content normal.     Assessment/Plan   1 - Large Left Renal Stone - proceed with left PCNL today as planned. Her infectious parameters have been optimized as much as possible, she likely has some chronic colonization of stone but no active infection.  We rediscussed percutaneous nephrostolithotomy (PCNL) in detail including need for percutaneous access which is sometimes gained by the surgeon, and other times by radiology or through existing nephrostomy tubes if present. We specifically rediscussed that often times tubes remain in after surgery until we are confident all stone has been treated. We rementioned that staged surgery is needed in over 50% of cases of very large or complex stone. We then rediscussed general risks including bleeding, infection, damage to kidney / ureter / bladder, loss of kidney, as well as anesthetic risks and rare but serious surgical complications including DVT, PE, MI, and mortality.  She voiced understanding and desires to proceed today as planned.    [name] 10/13/2014, 6:15 AM

## 2014-10-13 NOTE — Transfer of Care (Signed)
Immediate Anesthesia Transfer of Care Note  Patient: Cynthia Li  Procedure(s) Performed: Procedure(s): NEPHROLITHOTOMY PERCUTANEOUS WITH SURGEON ACCESS (Left) CYSTOSCOPY WITH RETROGRADE PYELOGRAM/URETERAL STENT PLACEMENT (Left) HOLMIUM LASER APPLICATION (Left)  Patient Location: PACU  Anesthesia Type:General  Level of Consciousness: awake  Airway & Oxygen Therapy: Patient Spontanous Breathing and Patient connected to face mask oxygen  Post-op Assessment: Report given to PACU RN and Post -op Vital signs reviewed and stable  Post vital signs: Reviewed and stable  Complications: No apparent anesthesia complications

## 2014-10-13 NOTE — Anesthesia Preprocedure Evaluation (Signed)
Anesthesia Evaluation  Patient identified by MRN, date of birth, ID band Patient awake    Reviewed: Allergy & Precautions, NPO status , Patient's Chart, lab work & pertinent test results  History of Anesthesia Complications (+) Family history of anesthesia reaction and history of anesthetic complications  Airway Mallampati: II  TM Distance: >3 FB Neck ROM: Full    Dental no notable dental hx.    Pulmonary neg pulmonary ROS,  breath sounds clear to auscultation  Pulmonary exam normal       Cardiovascular Exercise Tolerance: Good negative cardio ROS  Rhythm:Regular Rate:Normal     Neuro/Psych PSYCHIATRIC DISORDERS Anxiety  Neuromuscular disease    GI/Hepatic Neg liver ROS, hiatal hernia, GERD-  Medicated,  Endo/Other  negative endocrine ROS  Renal/GU Renal disease  negative genitourinary   Musculoskeletal  (+) Arthritis -,   Abdominal (+) + obese,   Peds negative pediatric ROS (+)  Hematology negative hematology ROS (+)   Anesthesia Other Findings   Reproductive/Obstetrics negative OB ROS                             Anesthesia Physical Anesthesia Plan  ASA: II  Anesthesia Plan: General   Post-op Pain Management:    Induction: Intravenous  Airway Management Planned: Oral ETT  Additional Equipment:   Intra-op Plan:   Post-operative Plan: Extubation in OR  Informed Consent: I have reviewed the patients History and Physical, chart, labs and discussed the procedure including the risks, benefits and alternatives for the proposed anesthesia with the patient or authorized representative who has indicated his/her understanding and acceptance.   Dental advisory given  Plan Discussed with: CRNA  Anesthesia Plan Comments:         Anesthesia Quick Evaluation

## 2014-10-13 NOTE — Brief Op Note (Signed)
10/13/2014  10:56 AM  PATIENT:  Cynthia Li  59 y.o. female  PRE-OPERATIVE DIAGNOSIS:  LARGE LEFT RENAL STONE  POST-OPERATIVE DIAGNOSIS:  large left renal stone  PROCEDURE:  Procedure(s): NEPHROLITHOTOMY PERCUTANEOUS WITH SURGEON ACCESS (Left) CYSTOSCOPY WITH RETROGRADE PYELOGRAM/URETERAL STENT PLACEMENT (Left) HOLMIUM LASER APPLICATION (Left)  SURGEON:  Surgeon(s) and Role:    * Sebastian Acheheodore Amaryllis Malmquist, MD - Primary  PHYSICIAN ASSISTANT:   ASSISTANTS: none   ANESTHESIA:   general  EBL:  Total I/O In: 1000 [I.V.:1000] Out: 200 [Urine:200]  BLOOD ADMINISTERED:none  DRAINS: Foley to straight drain   LOCAL MEDICATIONS USED:  NONE  SPECIMEN:  Source of Specimen:  Left Renal stone fragments  DISPOSITION OF SPECIMEN:  [facility] for compositional analysis  COUNTS:  YES  TOURNIQUET:  * No tourniquets in log *  DICTATION: .Other Dictation: Dictation Number [id number]  PLAN OF CARE: Admit to inpatient   PATIENT DISPOSITION:  PACU - hemodynamically stable.   Delay start of Pharmacological VTE agent (>24hrs) due to surgical blood loss or risk of bleeding: yes

## 2014-10-13 NOTE — Progress Notes (Signed)
Increase pain on RIGHT side and treatment for UTI started since PST visit

## 2014-10-13 NOTE — Anesthesia Postprocedure Evaluation (Signed)
  Anesthesia Post-op Note  Patient: Cynthia Li  Procedure(s) Performed: Procedure(s) (LRB): NEPHROLITHOTOMY PERCUTANEOUS WITH SURGEON ACCESS (Left) CYSTOSCOPY WITH RETROGRADE PYELOGRAM/URETERAL STENT PLACEMENT (Left) HOLMIUM LASER APPLICATION (Left)  Patient Location: PACU  Anesthesia Type: General  Level of Consciousness: awake and alert   Airway and Oxygen Therapy: Patient Spontanous Breathing  Post-op Pain: mild  Post-op Assessment: Post-op Vital signs reviewed, Patient's Cardiovascular Status Stable, Respiratory Function Stable, Patent Airway and No signs of Nausea or vomiting  Last Vitals:  Filed Vitals:   10/13/14 1202  BP: 146/74  Pulse: 66  Temp: 36.8 C  Resp: 16    Post-op Vital Signs: stable   Complications: No apparent anesthesia complications

## 2014-10-13 NOTE — Anesthesia Procedure Notes (Signed)
Procedure Name: Intubation Performed by: Leroy LibmanEARDON, Homer Miller L Patient Re-evaluated:Patient Re-evaluated prior to inductionOxygen Delivery Method: Circle system utilized Preoxygenation: Pre-oxygenation with 100% oxygen Intubation Type: IV induction Ventilation: Mask ventilation without difficulty and Oral airway inserted - appropriate to patient size Laryngoscope Size: Hyacinth MeekerMiller and 2 Grade View: Grade II Tube type: Oral Tube size: 7.5 mm Number of attempts: 1 Airway Equipment and Method: Stylet Placement Confirmation: ETT inserted through vocal cords under direct vision,  positive ETCO2 and breath sounds checked- equal and bilateral Secured at: 21 cm Tube secured with: Tape Dental Injury: Teeth and Oropharynx as per pre-operative assessment

## 2014-10-13 NOTE — Discharge Instructions (Signed)
1 - You may have urinary urgency (bladder spasms) and bloody urine on / off with stent in place. This is normal. ° °2 - Call MD or go to ER for fever >102, severe pain / nausea / vomiting not relieved by medications, or acute change in medical status ° °

## 2014-10-14 DIAGNOSIS — N200 Calculus of kidney: Secondary | ICD-10-CM | POA: Diagnosis not present

## 2014-10-14 LAB — BASIC METABOLIC PANEL
ANION GAP: 5 (ref 5–15)
BUN: 10 mg/dL (ref 6–23)
CHLORIDE: 105 mmol/L (ref 96–112)
CO2: 29 mmol/L (ref 19–32)
Calcium: 9.2 mg/dL (ref 8.4–10.5)
Creatinine, Ser: 0.79 mg/dL (ref 0.50–1.10)
GFR calc non Af Amer: 89 mL/min — ABNORMAL LOW (ref >90)
Glucose, Bld: 121 mg/dL — ABNORMAL HIGH (ref 70–99)
Potassium: 4.6 mmol/L (ref 3.5–5.1)
Sodium: 139 mmol/L (ref 135–145)

## 2014-10-14 LAB — HEMOGLOBIN AND HEMATOCRIT, BLOOD
HCT: 36.9 % (ref 36.0–46.0)
HEMOGLOBIN: 11.8 g/dL — AB (ref 12.0–15.0)

## 2014-10-14 NOTE — Progress Notes (Signed)
UR completed 

## 2014-10-14 NOTE — Discharge Summary (Signed)
Date of admission: 10/13/2014  Date of discharge: 10/14/2014  Admission diagnosis: Nephrolithiasis  Discharge diagnosis: same  Secondary diagnoses: none  History and Physical: For full details, please see admission history and physical. Briefly, Cynthia Li is a 59 y.o. year old patient with nephrolithiasis who elected for L PCNL.   Hospital Course: Uncomplicated. Underwent L tubeless PCNL with ureteral stent placement. POD#1 foley was removed and she voided light rose colored urine without clots. She was discharged home in good condition.  Laboratory values:  Recent Labs  10/13/14 1300 10/14/14 0528  HGB 13.4 11.8*  HCT 39.4 36.9    Recent Labs  10/13/14 1300 10/14/14 0528  CREATININE 0.99 0.79    Disposition: Home  Discharge instruction: The patient was instructed to be ambulatory but told to refrain from heavy lifting, strenuous activity, or driving.   Call if temp >101.5, or tomato juice colored urine. Expect some hematuria. Stay well hydrated.  Expect leakage from back incision. Reinforce dressing as needed.   Discharge medications:    Medication List    STOP taking these medications        HYDROcodone-acetaminophen 5-325 MG per tablet  Commonly known as:  NORCO      TAKE these medications        cholecalciferol 1000 UNITS tablet  Commonly known as:  VITAMIN D  Take 1,000 Units by mouth daily.     Fish Oil 1000 MG Caps  Take 1 capsule by mouth daily.     fluticasone 50 MCG/ACT nasal spray  Commonly known as:  FLONASE  Place 1 spray into both nostrils daily.     ibuprofen 200 MG tablet  Commonly known as:  ADVIL,MOTRIN  Take 200-600 mg by mouth every 6 (six) hours as needed for moderate pain.     multivitamin with minerals tablet  Take 1 tablet by mouth daily.     naproxen 500 MG tablet  Commonly known as:  NAPROSYN  Take 1 tablet (500 mg total) by mouth 2 (two) times daily as needed for mild pain, moderate pain or headache (TAKE WITH  MEALS.).     omeprazole 40 MG capsule  Commonly known as:  PRILOSEC  Take 40 mg by mouth daily.     ondansetron 4 MG disintegrating tablet  Commonly known as:  ZOFRAN ODT  Take 1 tablet (4 mg total) by mouth every 8 (eight) hours as needed for nausea or vomiting.     OVER THE COUNTER MEDICATION  Omega Red Super Heart- 1 tablet daily     OVER THE COUNTER MEDICATION  Move Free Joint Health - 1 tablet daily     oxyCODONE-acetaminophen 5-325 MG per tablet  Commonly known as:  PERCOCET/ROXICET  Take 1-2 tablets by mouth every 6 (six) hours as needed for moderate pain or severe pain (Pain). Post-operatively     senna-docusate 8.6-50 MG per tablet  Commonly known as:  Senokot-S  Take 1 tablet by mouth 2 (two) times daily. While taking pain meds to prevent constipation     sulfamethoxazole-trimethoprim 800-160 MG per tablet  Commonly known as:  BACTRIM DS,SEPTRA DS  Take 1 tablet by mouth 2 (two) times daily. X 3 days. Start day prior to next Urology appointment     VITAMIN B 12 PO  Take 5,000 mcg by mouth daily.     vitamin E 400 UNIT capsule  Take 400 Units by mouth daily.        Followup:      Follow-up Information  Follow up with Sebastian AcheMANNY, THEODORE, MD On 10/27/2014.   Specialty:  Urology   Why:  at 2:15 for MD visit and office stent removal   Contact information:   [address] [place] [address] [phone]

## 2014-10-16 ENCOUNTER — Encounter (HOSPITAL_COMMUNITY): Payer: Self-pay | Admitting: Urology

## 2014-10-16 NOTE — Op Note (Signed)
NAME:  Cynthia Li, Cynthia Li              ACCOUNT NO.:  [id number]  MEDICAL RECORD NO.:  [id number]  LOCATION:                                 FACILITY:  PHYSICIAN:  Sebastian Acheheodore Snigdha Howser, MD     DATE OF BIRTH:  01/11/1956  DATE OF PROCEDURE:  10/13/2014 DATE OF DISCHARGE:  10/14/2014                              OPERATIVE REPORT   PREOPERATIVE DIAGNOSIS:  Large left renal stone.  PROCEDURES: 1. Left percutaneous nephrostolithotomy stone greater than 2 cm. 2. Left diagnostic ureteroscopy. 3. Insertion of left ureteral stent 6 x 24, no tether. 4. Left needle percutaneous access to the kidney with dilation of     tracts. 5. Left nephrostogram for image guidance with interpretation.  ESTIMATED BLOOD LOSS:  100 mL.  COMPLICATIONS:  None.  SPECIMEN:  Left renal stone fragments for compositional analysis.  FINDINGS: 1. Large impacted left renal stone impacted in the left lower pole     infundibulum. 2. Successful access to left mid pole calyx. 3. No evidence of extravasation or additional filling defects by     antegrade nephrostogram.  DRAINS:  Foley catheter straight drain.  INDICATIONS:  Ms. Cynthia Li is a pleasant 59 year old lady with history of obesity.  She was found on workup of colicky flank pain, hematuria, and had very large left-sided renal stone and this appeared to be ball- valving causing intermittent obstruction.  Given the large size of the stone, options were discussed including staged ureteroscopy versus staged shockwave lithotripsy versus left percutaneous nephrostolithotomy.  Hopefully at one stage, she wished to proceed with the latter.  Informed consent was obtained and placed in medical record. Though the patient has had some bacteriuria on and off, she has been treated for this according to cultures, which had been variably negative and positive in the last month, she has had no systemic signs of infection within at least last week.  PROCEDURE IN DETAIL:  The  patient being, Chesapeake EnergyCeleste Li, refractory, procedure being left percutaneous nephrostolithotomy was confirmed. Procedure was carried out.  Time-out was performed.  Intravenous antibiotics were administered.  General endotracheal anesthesia was introduced.  The patient was initially placed into a low lithotomy position.  Sterile field was created by prepping and draping the patient's vagina, introitus, and proximal thighs using iodine x3.  Next, cystourethroscopy was performed using 22-French rigid cystoscope with 30- degree offset lens.  Inspection of urinary bladder revealed no diverticula, calcifications, or papular lesions.  The left ureteral orifice was cannulated with a 6-French end-hole catheter and left retrograde pyelogram was obtained.  Left retrograde pyelogram demonstrated a single left ureter with single system, left kidney.  There was moderate hydronephrosis.  There was a large filling defect in the lower pole of calyx consistent with a known stone.  At this point, it was felt to be freely floating within the left kidney.  Under fluoroscopic guidance, the open-ended catheter was advanced at the level of the mid renal pelvis.  A Foley catheter was placed per urethra to straight drain, 10 mL sterile water in the balloon and the externalized ureteral catheter was fashioned to this and connected to primed IV extension tubing.  The patient was then repositioned into  a prone position employing prone view, padding of her knees and elbows, axillary rolls, and flexion of the table approximately 10 degrees to open up the space between her 12th rib and pelvis.  She was further fashioned and positioned using tape over foam padding.  A new sterile field was created by prepping and draping the patient's entire left flank as well as the previously placed left externalized ureteral stent in the single operative field, which was then towed out using simultaneous retrograde pyelography and  fluoroscopy.  A calyx was identified in the lower mid pole as this would provide the best access to all portions of the kidney and ureter and using an 18-gauge Chiba needle at 15 degrees of center and a bull's eye technique, this was cannulated from the inferolateral angle and 0.038 ZIPwire was easily advanced down the level of the ureter being guided by a KMP catheter. This was then exchanged for a superstiff wire via the KMP catheter.  The dual-lumen dilator was then carefully placed into the level of proximal ureter allowing the passage of the second ZIPwire over the urinary bladder which was exchanged for a second superstiff wire via the KMP catheter.  The percutaneous dressing was then applied over the site. One superstiff wire was set aside as a safety wire.  Next, a NephroMax balloon dilation apparatus was carefully positioned across the calyx and dilation track was performed by holding  the pressure at 20 atmospheres for 3 minutes and then advancing the sheath across this.  A rigid nephroscopy was then performed.  This revealed excellent placement of the sheath into the direct calyx with excellent non-acute angulation to the ureter.  Via the rigid nephroscopy, however, the stone in question was not immediately visualized, as such flexible nephroscopy was performed using a 16-French flexible cystoscope and the stone in question was found to be enlarged in infundibulum and the calyx was acutely angled to the tract.  This is somewhat difficult to visualize, but the edges were clearly noted and as such holmium laser energy was applied to the stone using settings of 0.3 joules and 20 hertz, with a 365-mm fiber and the edges of the stone were carefully ablated and they were to make it small enough to be dislodged to reposition into the renal pelvis.  Multiple basketing attempts were performed to help reposition the stone; however, this was not successful and remained lodged into this  acutely angled lower pole  infundibulum.  A single attempt was made at a second access directly onto the stone via this lower pole calyx.  Using a similar technique with the 18-gauge Chiba needle, at 15 degrees centered directly on to the stone, the ZIPwire was unable to exit the calyx, I was not satisfied with that access enough to dilate it.  It was felt that the safest way to proceed would be to continue using the acutely angled access, although time consuming this would be safe way to manage this large stone.  The flexible cystoscope was exchanged for a flexible ureteroscope, which allowed additional angulation, and using the same laser energy, the stone was fragmented into smaller pieces, which were then amenable to simple basketing.  An escape-type basket was used to grasp these fragments and they were removed in their entirety, set aside for compositional analysis.  A repeat flexible nephroscopic examination of the kidney x2 revealed complete resolution of all stone fragments larger than one-third millimeter.  There was no evidence of perforation.  There was excellent hemostasis.  No evidence of UPJ disruption.  The previous nephroureteral externalized catheter was removed to make more room in the ureter and the flexible digital ureteroscope was advanced over the superstiff working wire slowly to urinary bladder using fluoroscopic guidance. Next, flexible digital ureteroscopy was performed to the entire length of the left ureter and no additional stone fragments larger than one- third millimeter or obvious mucosal abnormality of the ureter was seen. As we achieved the goals of the surgery today in terms of stone removal, attention was directed at placement of a stent.  A ZIPwire was advanced in antegrade fashion to the urinary bladder.  Using the nephroscopic and fluoroscopic guidance, a 6 x 24 Contour type stent was carefully placed. Good proximal and distal deployment were noted  fluoroscopically and with direct vision.  The final remaining superstiff safety wire was removed and the sheath was removed with the tract being sealed with 10 mL of FloSeal followed by interrupted Vicryl at the level of skin.  A final antegrade nephrostogram just prior to sheath removal revealed no evidence of extravasation.  Procedure was then terminated.  The patient tolerated the procedure well.  There were no immediate periprocedural complications.  The patient was taken to the Postanesthesia Care Unit in stable condition.          ______________________________ Sebastian Ache, MD     TM/MEDQ  D:  10/13/2014  T:  10/13/2014  Job:  [id number]

## 2015-07-13 ENCOUNTER — Ambulatory Visit (INDEPENDENT_AMBULATORY_CARE_PROVIDER_SITE_OTHER): Payer: BC Managed Care – PPO | Admitting: Obstetrics & Gynecology

## 2015-07-13 ENCOUNTER — Encounter: Payer: Self-pay | Admitting: Obstetrics & Gynecology

## 2015-07-13 VITALS — BP 118/78 | HR 60 | Resp 16 | Ht 65.0 in | Wt 216.0 lb

## 2015-07-13 DIAGNOSIS — Z01419 Encounter for gynecological examination (general) (routine) without abnormal findings: Secondary | ICD-10-CM | POA: Diagnosis not present

## 2015-07-13 DIAGNOSIS — Z1211 Encounter for screening for malignant neoplasm of colon: Secondary | ICD-10-CM | POA: Diagnosis not present

## 2015-07-13 NOTE — Progress Notes (Signed)
59 y.o. [id number] SingleCaucasianF here for annual exam.  Doing well.  Had kidney stone last year that needed to be surgically removed.  She is now on supplemental magnesium to help prevent stone formation.   Denies vaginal bleeding.    Has questions about 3D MMG.  Pt has grade B breast density.  She and I discussed benefit of 3D with this finding.  Pt is considering having fusion of low back.  Started physical therapy and did four weeks.  Back pain was no different.  Reports her good friend, Clarisse Gouge, is now on hospice care.  She had a melanoma under her thumb.    PCP:  Dr. Sharon Mt.  Last appt was November, 2015.   Patient's last menstrual period was 09/22/2006.          Sexually active: No.  The current method of family planning is status post hysterectomy.    Exercising: No.  not regularly Smoker:  no  Health Maintenance: Pap:  06/22/06 WNL History of abnormal Pap:  no MMG:  12/01/13 3D BiRads 1-negative Colonoscopy:  none BMD:   2011-normal TDaP:  PCP Screening Labs: Dr. Sharon Mt, Hb today: Dr. Sharon Mt, Urine today: Dr. Sharon Mt   reports that she has never smoked. She has never used smokeless tobacco. She reports that she drinks alcohol. She reports that she does not use illicit drugs.  Past Medical History  Diagnosis Date  . IBS (irritable bowel syndrome)     age 4  . Panic disorder   . Kidney stone 3/14    no surgery  . Hiatal hernia   . Pinched nerve     some DDD  . Family history of adverse reaction to anesthesia     father- slow to wake up   . Complication of anesthesia     once when teeth were numb made heart race   . GERD (gastroesophageal reflux disease)   . Arthritis   . Lumbar disc disease     Past Surgical History  Procedure Laterality Date  . Total abdominal hysterectomy  09/2006  . Cholecystectomy  age 72  . Knee arthroscopy      right  . Nephrolithotomy Left 10/13/2014    Procedure: NEPHROLITHOTOMY PERCUTANEOUS WITH SURGEON ACCESS;  Surgeon:  Sebastian Ache, MD;  Location: [facility];  Service: Urology;  Laterality: Left;  . Cystoscopy w/ ureteral stent placement Left 10/13/2014    Procedure: CYSTOSCOPY WITH RETROGRADE PYELOGRAM/URETERAL STENT PLACEMENT;  Surgeon: Sebastian Ache, MD;  Location: [facility];  Service: Urology;  Laterality: Left;  . Holmium laser application Left 10/13/2014    Procedure: HOLMIUM LASER APPLICATION;  Surgeon: Sebastian Ache, MD;  Location: [facility];  Service: Urology;  Laterality: Left;    Family History  Problem Relation Age of Onset  . Parkinson's disease Father     deceased  . Cancer Father     renal tumor  . Ulcerative colitis Sister   . Heart attack Father   . Alzheimer's disease Father     ROS:  Pertinent items are noted in HPI.  Otherwise, a comprehensive ROS was negative.  Exam:   BP 118/78 mmHg  Pulse 60  Resp 16  Ht  (1.651 m)  Wt 216 lb (97.977 kg)  BMI 35.94 kg/m2  LMP 09/22/2006   Height:  (165.1 cm)  Ht Readings from Last 3 Encounters:  07/13/15  (1.651 m)  10/13/14  (1.676 m)  10/10/14  (1.676 m)    General appearance: alert, cooperative  and appears stated age Head: Normocephalic, without obvious abnormality, atraumatic Neck: no adenopathy, supple, symmetrical, trachea midline and thyroid normal to inspection and palpation Lungs: clear to auscultation bilaterally Breasts: normal appearance, no masses or tenderness Heart: regular rate and rhythm Abdomen: soft, non-tender; bowel sounds normal; no masses,  no organomegaly Extremities: extremities normal, atraumatic, no cyanosis or edema Skin: Skin color, texture, turgor normal. No rashes or lesions Lymph nodes: Cervical, supraclavicular, and axillary nodes normal. No abnormal inguinal nodes palpated Neurologic: Grossly normal   Pelvic: External genitalia:  no lesions              Urethra:  normal appearing urethra with no masses, tenderness or lesions              Bartholins and Skenes: normal                  Vagina: normal appearing vagina with normal color and discharge, no lesions              Cervix: absent              Pap taken: No. Bimanual Exam:  Uterus:  uterus absent              Adnexa: normal adnexa and no mass, fullness, tenderness               Rectovaginal: Confirms               Anus:  normal sphincter tone, no lesions  Chaperone was present for exam.  A:  Well Woman with normal exam  Labs with PCP  Declines colonoscopy.  Pt aware I recommend.   IFOB given. Elevated Lipids being followed by Dr. Sharon MtHolwerta.  S/P TAH, no HRT   P: Mammogram yearly. No pap smear. H/O TAH.  OC light given today Has appt with Dr. Sharon MtHolwerta in November return annually or prn

## 2015-07-26 ENCOUNTER — Other Ambulatory Visit: Payer: Self-pay | Admitting: Neurosurgery

## 2015-07-26 DIAGNOSIS — M4316 Spondylolisthesis, lumbar region: Secondary | ICD-10-CM

## 2015-07-31 ENCOUNTER — Ambulatory Visit
Admission: RE | Admit: 2015-07-31 | Discharge: 2015-07-31 | Disposition: A | Discharge status: Home / Self Care | Payer: BC Managed Care – PPO | Source: Ambulatory Visit | Attending: Neurosurgery | Admitting: Neurosurgery

## 2015-07-31 DIAGNOSIS — M4316 Spondylolisthesis, lumbar region: Secondary | ICD-10-CM

## 2015-08-10 ENCOUNTER — Other Ambulatory Visit (HOSPITAL_COMMUNITY): Payer: Self-pay | Admitting: Neurosurgery

## 2015-08-31 ENCOUNTER — Encounter (HOSPITAL_COMMUNITY)
Admission: RE | Admit: 2015-08-31 | Discharge: 2015-08-31 | Disposition: A | Discharge status: Home / Self Care | Payer: BC Managed Care – PPO | Source: Ambulatory Visit | Attending: Neurosurgery | Admitting: Neurosurgery

## 2015-08-31 ENCOUNTER — Encounter (HOSPITAL_COMMUNITY): Payer: Self-pay

## 2015-08-31 DIAGNOSIS — Z01812 Encounter for preprocedural laboratory examination: Secondary | ICD-10-CM | POA: Diagnosis not present

## 2015-08-31 DIAGNOSIS — M4310 Spondylolisthesis, site unspecified: Secondary | ICD-10-CM | POA: Insufficient documentation

## 2015-08-31 DIAGNOSIS — Z0183 Encounter for blood typing: Secondary | ICD-10-CM | POA: Insufficient documentation

## 2015-08-31 LAB — CBC
HCT: 41.6 % (ref 36.0–46.0)
HEMOGLOBIN: 13.4 g/dL (ref 12.0–15.0)
MCH: 29.4 pg (ref 26.0–34.0)
MCHC: 32.2 g/dL (ref 30.0–36.0)
MCV: 91.2 fL (ref 78.0–100.0)
PLATELETS: 290 10*3/uL (ref 150–400)
RBC: 4.56 MIL/uL (ref 3.87–5.11)
RDW: 12.9 % (ref 11.5–15.5)
WBC: 6.3 10*3/uL (ref 4.0–10.5)

## 2015-08-31 LAB — BASIC METABOLIC PANEL
Anion gap: 8 (ref 5–15)
BUN: 9 mg/dL (ref 6–20)
CALCIUM: 10.1 mg/dL (ref 8.9–10.3)
CHLORIDE: 104 mmol/L (ref 101–111)
CO2: 28 mmol/L (ref 22–32)
CREATININE: 0.63 mg/dL (ref 0.44–1.00)
GFR calc non Af Amer: >60 mL/min (ref >60)
GLUCOSE: 98 mg/dL (ref 65–99)
Potassium: 4.3 mmol/L (ref 3.5–5.1)
Sodium: 140 mmol/L (ref 135–145)

## 2015-08-31 LAB — SURGICAL PCR SCREEN
MRSA, PCR: NEGATIVE
Staphylococcus aureus: NEGATIVE

## 2015-08-31 LAB — ABO/RH: ABO/RH(D): O POS

## 2015-08-31 NOTE — Pre-Procedure Instructions (Signed)
Park MeoCeleste D Chiem  08/31/2015      [id number] [place], [place] - [address] [facility] [id number] [place] [id number] Phone: [phone] Fax: [phone]    Your procedure is scheduled on Dec 20  Report to [facility] Admitting at [phone].  Call this number if you have problems the morning of surgery:  [phone]   Remember:  Do not eat food or drink liquids after midnight.  Take these medicines the morning of surgery with A SIP OF WATER: Flonase spray, Prilosec   Stop taking aspirin, Ibuprofen, BC's, Goody's, Herbal medications, Fish Oil, Aleve   Do not wear jewelry, make-up or nail polish.  Do not wear lotions, powders, or perfumes.  You may wear deodorant.  Do not shave 48 hours prior to surgery.  Men may shave face and neck.  Do not bring valuables to the hospital.  [facility] is not responsible for any belongings or valuables.  Contacts, dentures or bridgework may not be worn into surgery.  Leave your suitcase in the car.  After surgery it may be brought to your room.  For patients admitted to the hospital, discharge time will be determined by your treatment team.  Patients discharged the day of surgery will not be allowed to drive home.    Special instructions:  [place] - Preparing for Surgery  Before surgery, you can play an important role.  Because skin is not sterile, your skin needs to be as free of germs as possible.  You can reduce the number of germs on you skin by washing with CHG (chlorahexidine gluconate) soap before surgery.  CHG is an antiseptic cleaner which kills germs and bonds with the skin to continue killing germs even after washing.  Please DO NOT use if you have an allergy to CHG or antibacterial soaps.  If your skin becomes reddened/irritated stop using the CHG and inform your nurse when you arrive at Short Stay.  Do not shave (including legs and underarms) for at least  48 hours prior to the first CHG shower.  You may shave your face.  Please follow these instructions carefully:   1.  Shower with CHG Soap the night before surgery and the    morning of Surgery.  2.  If you choose to wash your hair, wash your hair first as usual with your   normal shampoo.  3.  After you shampoo, rinse your hair and body thoroughly to remove the   Shampoo.  4.  Use CHG as you would any other liquid soap.  You can apply chg directly  to the skin and wash gently with scrungie or a clean washcloth.  5.  Apply the CHG Soap to your body ONLY FROM THE NECK DOWN.    Do not use on open wounds or open sores.  Avoid contact with your eyes,   ears, mouth and genitals (private parts).  Wash genitals (private parts)   with your normal soap.  6.  Wash thoroughly, paying special attention to the area where your surgery  will be performed.  7.  Thoroughly rinse your body with warm water from the neck down.  8.  DO NOT shower/wash with your normal soap after using and rinsing off  the CHG Soap.  9.  Pat yourself dry with a clean towel.            10.  Wear clean pajamas.  11.  Place clean sheets on your bed the night of your first shower and do not sleep with pets.  Day of Surgery  Do not apply any lotions/deoderants the morning of surgery.  Please wear clean clothes to the hospital/surgery center.     Please read over the following fact sheets that you were given. Pain Booklet, Coughing and Deep Breathing, Blood Transfusion Information, MRSA Information and Surgical Site Infection Prevention

## 2015-08-31 NOTE — Progress Notes (Signed)
PCP is Dr Alysia PennaScott Holwerda Denies seeing a cardiologist Denies ever having a stress test, echo, or card cath.

## 2015-09-01 LAB — TYPE AND SCREEN
ABO/RH(D): O POS
Antibody Screen: NEGATIVE

## 2015-09-10 MED ORDER — VANCOMYCIN HCL IN DEXTROSE 1-5 GM/200ML-% IV SOLN
1000.0000 mg | INTRAVENOUS | Status: AC
  Administered 2015-09-11: 1000 mg via INTRAVENOUS
  Filled 2015-09-10: qty 200

## 2015-09-10 MED ORDER — DEXAMETHASONE SODIUM PHOSPHATE 10 MG/ML IJ SOLN
10.0000 mg | INTRAMUSCULAR | Status: AC
  Administered 2015-09-11: 10 mg via INTRAVENOUS
  Filled 2015-09-10: qty 1

## 2015-09-11 ENCOUNTER — Encounter (HOSPITAL_COMMUNITY): Payer: Self-pay | Admitting: *Deleted

## 2015-09-11 ENCOUNTER — Inpatient Hospital Stay (HOSPITAL_COMMUNITY): Payer: BC Managed Care – PPO | Admitting: Anesthesiology

## 2015-09-11 ENCOUNTER — Encounter (HOSPITAL_COMMUNITY)
Admission: AD | Disposition: A | Discharge status: Home / Self Care | Payer: Self-pay | Source: Ambulatory Visit | Attending: Neurosurgery

## 2015-09-11 ENCOUNTER — Inpatient Hospital Stay (HOSPITAL_COMMUNITY): Payer: BC Managed Care – PPO

## 2015-09-11 ENCOUNTER — Inpatient Hospital Stay (HOSPITAL_COMMUNITY)
Admission: AD | Admit: 2015-09-11 | Discharge: 2015-09-13 | DRG: 460 | Disposition: A | Discharge status: Home / Self Care | Payer: BC Managed Care – PPO | Source: Ambulatory Visit | Attending: Neurosurgery | Admitting: Neurosurgery

## 2015-09-11 DIAGNOSIS — Z880 Allergy status to penicillin: Secondary | ICD-10-CM

## 2015-09-11 DIAGNOSIS — Z79899 Other long term (current) drug therapy: Secondary | ICD-10-CM

## 2015-09-11 DIAGNOSIS — F410 Panic disorder [episodic paroxysmal anxiety] without agoraphobia: Secondary | ICD-10-CM | POA: Diagnosis present

## 2015-09-11 DIAGNOSIS — M48062 Spinal stenosis, lumbar region with neurogenic claudication: Secondary | ICD-10-CM | POA: Diagnosis present

## 2015-09-11 DIAGNOSIS — M4806 Spinal stenosis, lumbar region: Secondary | ICD-10-CM | POA: Diagnosis present

## 2015-09-11 DIAGNOSIS — K219 Gastro-esophageal reflux disease without esophagitis: Secondary | ICD-10-CM | POA: Diagnosis present

## 2015-09-11 DIAGNOSIS — M545 Low back pain: Secondary | ICD-10-CM | POA: Diagnosis present

## 2015-09-11 DIAGNOSIS — K589 Irritable bowel syndrome without diarrhea: Secondary | ICD-10-CM | POA: Diagnosis present

## 2015-09-11 DIAGNOSIS — M549 Dorsalgia, unspecified: Secondary | ICD-10-CM

## 2015-09-11 DIAGNOSIS — Z885 Allergy status to narcotic agent status: Secondary | ICD-10-CM

## 2015-09-11 SURGERY — POSTERIOR LUMBAR FUSION 1 LEVEL
Anesthesia: General | Site: Back

## 2015-09-11 MED ORDER — THROMBIN 20000 UNITS EX SOLR
CUTANEOUS | Status: DC | PRN
  Administered 2015-09-11: 20 mL via TOPICAL

## 2015-09-11 MED ORDER — METHOCARBAMOL 500 MG PO TABS
500.0000 mg | ORAL_TABLET | Freq: Four times a day (QID) | ORAL | Status: DC | PRN
  Administered 2015-09-11 – 2015-09-13 (×5): 500 mg via ORAL
  Filled 2015-09-11 (×4): qty 1

## 2015-09-11 MED ORDER — PANTOPRAZOLE SODIUM 40 MG IV SOLR
40.0000 mg | Freq: Every day | INTRAVENOUS | Status: DC
  Administered 2015-09-11: 40 mg via INTRAVENOUS
  Filled 2015-09-11 (×2): qty 40

## 2015-09-11 MED ORDER — NEOSTIGMINE METHYLSULFATE 10 MG/10ML IV SOLN
INTRAVENOUS | Status: AC
  Filled 2015-09-11: qty 1

## 2015-09-11 MED ORDER — LIDOCAINE HCL (CARDIAC) 20 MG/ML IV SOLN
INTRAVENOUS | Status: AC
  Filled 2015-09-11: qty 5

## 2015-09-11 MED ORDER — FENTANYL CITRATE (PF) 100 MCG/2ML IJ SOLN
INTRAMUSCULAR | Status: DC | PRN
  Administered 2015-09-11: 50 ug via INTRAVENOUS
  Administered 2015-09-11 (×2): 25 ug via INTRAVENOUS
  Administered 2015-09-11 (×3): 50 ug via INTRAVENOUS
  Administered 2015-09-11: 100 ug via INTRAVENOUS
  Administered 2015-09-11: 50 ug via INTRAVENOUS
  Administered 2015-09-11 (×2): 25 ug via INTRAVENOUS
  Administered 2015-09-11: 50 ug via INTRAVENOUS

## 2015-09-11 MED ORDER — FENTANYL CITRATE (PF) 250 MCG/5ML IJ SOLN
INTRAMUSCULAR | Status: AC
  Filled 2015-09-11: qty 5

## 2015-09-11 MED ORDER — PROPOFOL 10 MG/ML IV BOLUS
INTRAVENOUS | Status: DC | PRN
  Administered 2015-09-11: 10 mg via INTRAVENOUS
  Administered 2015-09-11: 180 mg via INTRAVENOUS

## 2015-09-11 MED ORDER — ROCURONIUM BROMIDE 50 MG/5ML IV SOLN
INTRAVENOUS | Status: AC
  Filled 2015-09-11: qty 2

## 2015-09-11 MED ORDER — NEOSTIGMINE METHYLSULFATE 10 MG/10ML IV SOLN
INTRAVENOUS | Status: DC | PRN
  Administered 2015-09-11: 4 mg via INTRAVENOUS

## 2015-09-11 MED ORDER — MIDAZOLAM HCL 2 MG/2ML IJ SOLN
INTRAMUSCULAR | Status: AC
  Filled 2015-09-11: qty 2

## 2015-09-11 MED ORDER — BUPIVACAINE HCL (PF) 0.5 % IJ SOLN
INTRAMUSCULAR | Status: DC | PRN
  Administered 2015-09-11: 20 mL

## 2015-09-11 MED ORDER — DEXAMETHASONE 4 MG PO TABS
4.0000 mg | ORAL_TABLET | Freq: Four times a day (QID) | ORAL | Status: AC
  Administered 2015-09-11 (×2): 4 mg via ORAL
  Filled 2015-09-11 (×2): qty 1

## 2015-09-11 MED ORDER — VANCOMYCIN HCL IN DEXTROSE 1-5 GM/200ML-% IV SOLN
1000.0000 mg | Freq: Two times a day (BID) | INTRAVENOUS | Status: DC
  Administered 2015-09-11 – 2015-09-13 (×4): 1000 mg via INTRAVENOUS
  Filled 2015-09-11 (×6): qty 200

## 2015-09-11 MED ORDER — LIDOCAINE HCL (CARDIAC) 20 MG/ML IV SOLN
INTRAVENOUS | Status: DC | PRN
  Administered 2015-09-11: 20 mg via INTRAVENOUS
  Administered 2015-09-11: 50 mg via INTRAVENOUS

## 2015-09-11 MED ORDER — ONDANSETRON HCL 4 MG/2ML IJ SOLN: 4.0000 mg | Freq: Once | INTRAMUSCULAR | Status: DC | PRN

## 2015-09-11 MED ORDER — 0.9 % SODIUM CHLORIDE (POUR BTL) OPTIME
TOPICAL | Status: DC | PRN
Start: 2015-09-11 — End: 2015-09-11
  Administered 2015-09-11: 1000 mL

## 2015-09-11 MED ORDER — GLYCOPYRROLATE 0.2 MG/ML IJ SOLN
INTRAMUSCULAR | Status: AC
  Filled 2015-09-11: qty 3

## 2015-09-11 MED ORDER — DEXAMETHASONE SODIUM PHOSPHATE 4 MG/ML IJ SOLN: 4.0000 mg | Freq: Four times a day (QID) | INTRAMUSCULAR | Status: AC

## 2015-09-11 MED ORDER — PHENOL 1.4 % MT LIQD: 1.0000 | OROMUCOSAL | Status: DC | PRN

## 2015-09-11 MED ORDER — MENTHOL 3 MG MT LOZG: 1.0000 | LOZENGE | OROMUCOSAL | Status: DC | PRN

## 2015-09-11 MED ORDER — SODIUM CHLORIDE 0.9 % IJ SOLN: 3.0000 mL | INTRAMUSCULAR | Status: DC | PRN

## 2015-09-11 MED ORDER — ACETAMINOPHEN 650 MG RE SUPP: 650.0000 mg | RECTAL | Status: DC | PRN

## 2015-09-11 MED ORDER — ZOLPIDEM TARTRATE 5 MG PO TABS: 5.0000 mg | ORAL_TABLET | Freq: Every evening | ORAL | Status: DC | PRN

## 2015-09-11 MED ORDER — ROCURONIUM BROMIDE 100 MG/10ML IV SOLN
INTRAVENOUS | Status: DC | PRN
  Administered 2015-09-11: 50 mg via INTRAVENOUS
  Administered 2015-09-11: 20 mg via INTRAVENOUS
  Administered 2015-09-11: 30 mg via INTRAVENOUS

## 2015-09-11 MED ORDER — KCL IN DEXTROSE-NACL 20-5-0.45 MEQ/L-%-% IV SOLN
80.0000 mL/h | INTRAVENOUS | Status: DC
  Administered 2015-09-11: 80 mL/h via INTRAVENOUS

## 2015-09-11 MED ORDER — OXYCODONE-ACETAMINOPHEN 5-325 MG PO TABS
1.0000 | ORAL_TABLET | ORAL | Status: DC | PRN
  Administered 2015-09-11 – 2015-09-13 (×10): 2 via ORAL
  Filled 2015-09-11 (×11): qty 2

## 2015-09-11 MED ORDER — FENTANYL CITRATE (PF) 100 MCG/2ML IJ SOLN
25.0000 ug | INTRAMUSCULAR | Status: DC | PRN
  Administered 2015-09-11 (×3): 50 ug via INTRAVENOUS

## 2015-09-11 MED ORDER — LACTATED RINGERS IV SOLN
INTRAVENOUS | Status: DC | PRN
  Administered 2015-09-11 (×2): via INTRAVENOUS

## 2015-09-11 MED ORDER — SODIUM CHLORIDE 0.9 % IR SOLN
Status: DC | PRN
  Administered 2015-09-11: 500 mL

## 2015-09-11 MED ORDER — MIDAZOLAM HCL 5 MG/5ML IJ SOLN
INTRAMUSCULAR | Status: DC | PRN
  Administered 2015-09-11: 2 mg via INTRAVENOUS

## 2015-09-11 MED ORDER — FENTANYL CITRATE (PF) 100 MCG/2ML IJ SOLN
INTRAMUSCULAR | Status: AC
  Filled 2015-09-11: qty 2

## 2015-09-11 MED ORDER — GLYCOPYRROLATE 0.2 MG/ML IJ SOLN
INTRAMUSCULAR | Status: DC | PRN
  Administered 2015-09-11: 0.6 mg via INTRAVENOUS

## 2015-09-11 MED ORDER — METHOCARBAMOL 500 MG PO TABS
ORAL_TABLET | ORAL | Status: AC
  Filled 2015-09-11: qty 1

## 2015-09-11 MED ORDER — ACETAMINOPHEN 325 MG PO TABS: 650.0000 mg | ORAL_TABLET | ORAL | Status: DC | PRN

## 2015-09-11 MED ORDER — SODIUM CHLORIDE 0.9 % IJ SOLN
3.0000 mL | Freq: Two times a day (BID) | INTRAMUSCULAR | Status: DC
  Administered 2015-09-11 – 2015-09-12 (×4): 3 mL via INTRAVENOUS

## 2015-09-11 MED ORDER — SENNA 8.6 MG PO TABS
1.0000 | ORAL_TABLET | Freq: Two times a day (BID) | ORAL | Status: DC
  Administered 2015-09-11 – 2015-09-13 (×4): 8.6 mg via ORAL
  Filled 2015-09-11 (×5): qty 1

## 2015-09-11 MED ORDER — ONDANSETRON HCL 4 MG/2ML IJ SOLN
INTRAMUSCULAR | Status: DC | PRN
  Administered 2015-09-11: 4 mg via INTRAVENOUS

## 2015-09-11 MED ORDER — VANCOMYCIN HCL 1000 MG IV SOLR
INTRAVENOUS | Status: AC
  Filled 2015-09-11: qty 1000

## 2015-09-11 MED ORDER — PROPOFOL 10 MG/ML IV BOLUS
INTRAVENOUS | Status: AC
  Filled 2015-09-11: qty 20

## 2015-09-11 MED ORDER — BISACODYL 5 MG PO TBEC: 5.0000 mg | DELAYED_RELEASE_TABLET | Freq: Every day | ORAL | Status: DC | PRN

## 2015-09-11 MED ORDER — METHOCARBAMOL 1000 MG/10ML IJ SOLN
500.0000 mg | Freq: Four times a day (QID) | INTRAVENOUS | Status: DC | PRN
  Filled 2015-09-11: qty 5

## 2015-09-11 MED ORDER — VANCOMYCIN HCL 1000 MG IV SOLR
INTRAVENOUS | Status: DC | PRN
  Administered 2015-09-11: 1000 mg

## 2015-09-11 MED ORDER — OXYCODONE-ACETAMINOPHEN 5-325 MG PO TABS
ORAL_TABLET | ORAL | Status: AC
  Filled 2015-09-11: qty 2

## 2015-09-11 MED ORDER — HYDROMORPHONE HCL 1 MG/ML IJ SOLN: 1.0000 mg | INTRAMUSCULAR | Status: DC | PRN

## 2015-09-11 MED ORDER — ONDANSETRON HCL 4 MG/2ML IJ SOLN
INTRAMUSCULAR | Status: AC
  Filled 2015-09-11: qty 2

## 2015-09-11 MED ORDER — ONDANSETRON HCL 4 MG/2ML IJ SOLN: 4.0000 mg | INTRAMUSCULAR | Status: DC | PRN

## 2015-09-11 SURGICAL SUPPLY — 60 items
APL SKNCLS STERI-STRIP NONHPOA (GAUZE/BANDAGES/DRESSINGS) ×2
BAG DECANTER FOR FLEXI CONT (MISCELLANEOUS) ×2 IMPLANT
BENZOIN TINCTURE PRP APPL 2/3 (GAUZE/BANDAGES/DRESSINGS) ×4 IMPLANT
BLADE CLIPPER SURG (BLADE) IMPLANT
BONE EQUIVA 5CC (Bone Implant) ×2 IMPLANT
BRUSH SCRUB EZ PLAIN DRY (MISCELLANEOUS) ×2 IMPLANT
BUR CUTTER 7.0 ROUND (BURR) ×3 IMPLANT
BUR MATCHSTICK NEURO 3.0 LAGG (BURR) ×2 IMPLANT
CANISTER SUCT 3000ML PPV (MISCELLANEOUS) ×2 IMPLANT
CONT SPEC 4OZ CLIKSEAL STRL BL (MISCELLANEOUS) ×2 IMPLANT
COVER BACK TABLE 60X90IN (DRAPES) ×2 IMPLANT
DRAPE C-ARM 42X72 X-RAY (DRAPES) ×4 IMPLANT
DRAPE LAPAROTOMY 100X72X124 (DRAPES) ×2 IMPLANT
DRAPE SURG 17X23 STRL (DRAPES) ×4 IMPLANT
DRSG OPSITE 4X5.5 SM (GAUZE/BANDAGES/DRESSINGS) ×1 IMPLANT
DRSG OPSITE POSTOP 4X6 (GAUZE/BANDAGES/DRESSINGS) ×2 IMPLANT
DURAPREP 26ML APPLICATOR (WOUND CARE) ×2 IMPLANT
ELECT REM PT RETURN 9FT ADLT (ELECTROSURGICAL) ×2
ELECTRODE REM PT RTRN 9FT ADLT (ELECTROSURGICAL) ×1 IMPLANT
EVACUATOR 1/8 PVC DRAIN (DRAIN) ×2 IMPLANT
GAUZE SPONGE 4X4 12PLY STRL (GAUZE/BANDAGES/DRESSINGS) ×2 IMPLANT
GAUZE SPONGE 4X4 16PLY XRAY LF (GAUZE/BANDAGES/DRESSINGS) IMPLANT
GLOVE ECLIPSE 8.0 STRL XLNG CF (GLOVE) ×4 IMPLANT
GLOVE EXAM NITRILE LRG STRL (GLOVE) IMPLANT
GLOVE EXAM NITRILE MD LF STRL (GLOVE) IMPLANT
GLOVE EXAM NITRILE XS STR PU (GLOVE) IMPLANT
GOWN STRL REUS W/ TWL LRG LVL3 (GOWN DISPOSABLE) IMPLANT
GOWN STRL REUS W/ TWL XL LVL3 (GOWN DISPOSABLE) ×2 IMPLANT
GOWN STRL REUS W/TWL 2XL LVL3 (GOWN DISPOSABLE) IMPLANT
GOWN STRL REUS W/TWL LRG LVL3 (GOWN DISPOSABLE)
GOWN STRL REUS W/TWL XL LVL3 (GOWN DISPOSABLE) ×4
HANDLE PEDIGUARD CANNULATED (INSTRUMENTS) ×1 IMPLANT
IMPLANT ARDIS PEEK 10X9X26 (Orthopedic Implant) ×2 IMPLANT
K-WIRE NITHNOL TROCAR TIP (WIRE) ×4 IMPLANT
KIT BASIN OR (CUSTOM PROCEDURE TRAY) ×2 IMPLANT
KIT ROOM TURNOVER OR (KITS) ×2 IMPLANT
LIQUID BAND (GAUZE/BANDAGES/DRESSINGS) IMPLANT
NEEDLE 1 PEDIGUARD CANNULATED (NEEDLE) ×2 IMPLANT
NEEDLE HYPO 22GX1.5 SAFETY (NEEDLE) ×2 IMPLANT
NS IRRIG 1000ML POUR BTL (IV SOLUTION) ×2 IMPLANT
PACK LAMINECTOMY NEURO (CUSTOM PROCEDURE TRAY) ×2 IMPLANT
PAD ARMBOARD 7.5X6 YLW CONV (MISCELLANEOUS) ×6 IMPLANT
PATTIES SURGICAL .75X.75 (GAUZE/BANDAGES/DRESSINGS) IMPLANT
ROD PATHFINDER 40MM (Rod) ×2 IMPLANT
SCREW POLYAXIA MIS 6.5X40MM (Screw) ×4 IMPLANT
SHEATH PAT (SHEATH) ×1 IMPLANT
SPONGE LAP 4X18 X RAY DECT (DISPOSABLE) IMPLANT
SPONGE SURGIFOAM ABS GEL 100 (HEMOSTASIS) ×2 IMPLANT
STRIP CLOSURE SKIN 1/2X4 (GAUZE/BANDAGES/DRESSINGS) ×4 IMPLANT
SUT PROLENE 0 CT 1 30 (SUTURE) ×1 IMPLANT
SUT VIC AB 0 CT1 18XCR BRD8 (SUTURE) ×1 IMPLANT
SUT VIC AB 0 CT1 8-18 (SUTURE) ×2
SUT VIC AB 2-0 OS6 18 (SUTURE) ×6 IMPLANT
SUT VIC AB 3-0 CP2 18 (SUTURE) ×2 IMPLANT
TOP CLSR SEQUOIA (Orthopedic Implant) ×4 IMPLANT
TOWEL OR 17X24 6PK STRL BLUE (TOWEL DISPOSABLE) ×2 IMPLANT
TOWEL OR 17X26 10 PK STRL BLUE (TOWEL DISPOSABLE) ×2 IMPLANT
TRAP SPECIMEN MUCOUS 40CC (MISCELLANEOUS) ×2 IMPLANT
TRAY FOLEY W/METER SILVER 14FR (SET/KITS/TRAYS/PACK) ×2 IMPLANT
WATER STERILE IRR 1000ML POUR (IV SOLUTION) ×2 IMPLANT

## 2015-09-11 NOTE — Progress Notes (Signed)
ANTIBIOTIC CONSULT NOTE - INITIAL  Pharmacy Consult for Vancomycin Indication: surgical prophylaxis  Allergies  Allergen Reactions  . Codeine     headaches  . Penicillins Hives    Has patient had a PCN reaction causing immediate rash, facial/tongue/throat swelling, SOB or lightheadedness with hypotension:  Has patient had a PCN reaction causing severe rash involving mucus membranes or skin necrosis:  Has patient had a PCN reaction that required hospitalization  Has patient had a PCN reaction occurring within the last 10 years:  If all of the above answers are "NO", then may proceed with Cephalosporin use.    Patient Measurements: Height: 5' 5.5" (166.4 cm) Weight: 211 lb (95.709 kg) IBW/kg (Calculated) : 58.15  Vital Signs: Temp: 99.1 F (37.3 C) (12/20 1305) Temp Source: Oral (12/20 0606) BP: 119/85 mmHg (12/20 1305) Pulse Rate: 50 (12/20 1305) Intake/Output from previous day:   Intake/Output from this shift: Total I/O In: 1940 [P.O.:240; I.V.:1700] Out: 500 [Urine:300; Other:100; Blood:100]  Labs: No results for input(s): WBC, HGB, PLT, LABCREA, CREATININE in the last 72 hours. Estimated Creatinine Clearance: 85.3 mL/min (by C-G formula based on Cr of 0.63).   Microbiology: Recent Results (from the past 720 hour(s))  Surgical pcr screen     Status: None   Collection Time: 08/31/15  9:30 AM  Result Value Ref Range Status   MRSA, PCR NEGATIVE NEGATIVE Final   Staphylococcus aureus NEGATIVE NEGATIVE Final    Comment:        The Xpert SA Assay (FDA approved for NASAL specimens in patients over 61 years of age), is one component of a comprehensive surveillance program.  Test performance has been validated by [facility] for patients greater than or equal to 61 year old. It is not intended to diagnose infection nor to guide or monitor treatment.     Medical History: Past Medical History  Diagnosis Date  . IBS (irritable bowel syndrome)     age 59  .  Panic disorder   . Kidney stone 3/14    no surgery  . Hiatal hernia   . Pinched nerve     some DDD  . Complication of anesthesia     once when teeth were numb made heart race   . GERD (gastroesophageal reflux disease)   . Arthritis   . Lumbar disc disease   . Family history of adverse reaction to anesthesia     father- slow to wake up     Medications:  Scheduled:  . dexamethasone  4 mg Intravenous 4 times per day   Or  . dexamethasone  4 mg Oral 4 times per day  . fentaNYL      . fentaNYL      . methocarbamol      . oxyCODONE-acetaminophen      . pantoprazole (PROTONIX) IV  40 mg Intravenous QHS  . senna  1 tablet Oral BID  . sodium chloride  3 mL Intravenous Q12H  . vancomycin       Assessment: 59 yo F s/p L4-5 posterior lumbar fusion on 09/11/15.  Pt received Vancomycin pre-op at 0725.  To continue Vancomycin post-op while drain in place.  Goal of Therapy:  Vancomycin trough level 10-15 mcg/ml  Plan:  Begin Vancomycin 1gm IV q12h - next dose at 1800 tonight. Follow-up for discontinuation of drain and antibiotics.  Toys 'R' UsKimberly Aidric Endicott, Pharm.D., BCPS Clinical Pharmacist Pager [phone] 09/11/2015 2:07 PM

## 2015-09-11 NOTE — Anesthesia Procedure Notes (Signed)
Procedure Name: Intubation Performed by: Everlene BallsHAYES, Yesenia Fontenette T Pre-anesthesia Checklist: Patient identified, Timeout performed, Emergency Drugs available, Suction available and Patient being monitored Patient Re-evaluated:Patient Re-evaluated prior to inductionOxygen Delivery Method: Circle system utilized Preoxygenation: Pre-oxygenation with 100% oxygen Intubation Type: IV induction Ventilation: Mask ventilation without difficulty and Oral airway inserted - appropriate to patient size Laryngoscope Size: Mac and 3 Grade View: Grade II Tube type: Oral Number of attempts: 2 Airway Equipment and Method: Bougie stylet and Stylet Placement Confirmation: ETT inserted through vocal cords under direct vision,  breath sounds checked- equal and bilateral and positive ETCO2 Secured at: 21 cm Tube secured with: Tape Dental Injury: Teeth and Oropharynx as per pre-operative assessment

## 2015-09-11 NOTE — Anesthesia Postprocedure Evaluation (Signed)
Anesthesia Post Note  Patient: Cynthia Li  Procedure(s) Performed: Procedure(s) (LRB): L4-5 POSTERIOR LUMBAR FUSION  (N/A)  Patient location during evaluation: PACU Anesthesia Type: General Level of consciousness: awake and awake and alert Pain management: pain level controlled Vital Signs Assessment: post-procedure vital signs reviewed and stable Respiratory status: spontaneous breathing and nonlabored ventilation Anesthetic complications: no    Last Vitals:  Filed Vitals:   09/11/15 1230 09/11/15 1305  BP:  119/85  Pulse: 48 50  Temp:  37.3 C  Resp: 10 18    Last Pain:  Filed Vitals:   09/11/15 1311  PainSc: 6                  Kalvin Buss COKER

## 2015-09-11 NOTE — Transfer of Care (Signed)
Immediate Anesthesia Transfer of Care Note  Patient: Cynthia Li  Procedure(s) Performed: Procedure(s) with comments: L4-5 POSTERIOR LUMBAR FUSION  (N/A) - L4-5 POSTERIOR LUMBAR FUSION   Patient Location: PACU  Anesthesia Type:General  Level of Consciousness: awake, patient cooperative and responds to stimulation  Airway & Oxygen Therapy: Patient Spontanous Breathing and Patient connected to nasal cannula oxygen  Post-op Assessment: Report given to RN, Post -op Vital signs reviewed and stable and Patient moving all extremities X 4  Post vital signs: Reviewed and stable  Last Vitals:  Filed Vitals:   09/11/15 0600 09/11/15 0606  BP:  162/87  Pulse: 87   Temp:  36.6 C  Resp: 20     Complications: No apparent anesthesia complications

## 2015-09-11 NOTE — Op Note (Signed)
Preop diagnosis: Spondylolisthesis L4-5 with severe central and lateral recess stenosis Postop diagnosis: Same Procedure: Bilateral L4-5 decompressive laminectomy for relief of central lateral recess stenosis more so than needed for interbody fusion Bilateral L4-5 microdiscectomy L4-5 posterior lumbar interbody fusion with peek interbody spacer L4-5 posterolateral fusion L4-5 nonsegmental instrumentation with Pathfinder percutaneous pedicle screw system Surgeon: Dwight Adamczak Asst.: Nundkumar  After being placed the prone position the patient's back was prepped and draped in the usual sterile fashion. Localizing fluoroscopy was used prior to incision to identify the appropriate level. Midline incision was made above the spinous processes of L4 and L5. Using Bovie cutting current the incision was carried on the spinous processes. We then dissected the plane between the dorsal lumbar fashion the subcutaneous tissue. We then did subperiosteal dissection on the spinous processes and lamina facet joint at L4 and L5 bilaterally. Self-retaining tract was placed for exposure and x-ray showed approach the appropriate level. Using Leksell rongeur spinous processes were removed. Generous laminotomy was performed on the left by removing the inferior 80% of the L4 lamina the medial three quarters of the facet joint and the superior one third of the L5 lamina. Residual bone and ligamentum flavum removed in a piecemeal fashion. General lateral recess decompression was carried out more so than needed for interbody fusion. We then did a similar decompression on the right and then removed the residual midline structures to complete the bilateral decompression. At this time we did bilateral microdiscectomy at L4-5. Thorough disc space cleanout was carried out while the same time great care was taken to avoid injury to the neural elements and this was successfully done. We then prepared the disc for interbody fusion by distracting  up to a 10 mm size. We prepared the endplates vigorously then placed 2 peek spacers of 10 minus by 26 mm size filled with a mixture of autologous bone and morselized allograft. Prior to placing the second spacer we placed the same mixture deep within the interspace to help with the interbody fusion. We then irrigated copiously confirmed good placed of the cages under fluoroscopy. We placed Gelfoam for hemostasis then closed the dorsal lumbar fashion with 0 Vicryl. We then placed percutaneous pedicle screws bilaterally at L4-5 in standard fashion. We passed a Jamshidi needles to the pedicles and then placed guidewires remove the needles. We tapped with a 6 mm tap and then placed 6.5 mm x 40 mm screws bilaterally at L4 and L5. We then passed rods down the towers and secured them to the top screws. We did tightening and final tightening with torque and counter torque and then remove the towers. Final fluoroscopy in AP lateral direction looked excellent. The was then irrigated and the dorsal lumbar fascia closed once again was 0 Vicryl. We left a drain in the suprafascial space and closed the wound in multiple layers of Vicryl followed by running locking Prolene on the skin. A sterile dressing was then applied and the patient was extubated and taken to recovery in stable condition.

## 2015-09-11 NOTE — H&P (Signed)
Cynthia Li is an 59 y.o. female.   Chief Complaint: Back and leg pains HPI: The patient is a 59 year old female who is evaluated in the office more than a year ago for lower back pain. She was tried on conservative therapy but then disappeared from our care for a number of months. She returned back to work carried November this year where she was evaluated with a new MRI scan. This shows spondylolisthesis at L4-5 with a disc herniation causing marked spinal stenosis. She was tried an additional conservative therapy still without improvement. She therefore requested surgery. She now comes for a bilateral decompression at L4-5 followed by interbody fusion and pedicle screw fixation. I've had a long discussion with her regarding the risks and benefits of surgical intervention. The risks discussed include but are not limited to bleeding infection weakness numbness paralysis spinal fluid leak trouble with instrumentation nonunion coma and death. We have discussed alternative methods of therapy along with the risks and benefits of nonintervention. She's had the opportunity to ask numerous questions and appears to understand. With this information in hand she has requested that we proceed with surgery.  Past Medical History  Diagnosis Date  . IBS (irritable bowel syndrome)     age 59  . Panic disorder   . Kidney stone 3/14    no surgery  . Hiatal hernia   . Pinched nerve     some DDD  . Complication of anesthesia     once when teeth were numb made heart race   . GERD (gastroesophageal reflux disease)   . Arthritis   . Lumbar disc disease   . Family history of adverse reaction to anesthesia     father- slow to wake up     Past Surgical History  Procedure Laterality Date  . Total abdominal hysterectomy  09/2006  . Cholecystectomy  age 59  . Knee arthroscopy      right  . Nephrolithotomy Left 10/13/2014    Procedure: NEPHROLITHOTOMY PERCUTANEOUS WITH SURGEON ACCESS;  Surgeon: Sebastian Acheheodore  Manny, MD;  Location: [facility];  Service: Urology;  Laterality: Left;  . Cystoscopy w/ ureteral stent placement Left 10/13/2014    Procedure: CYSTOSCOPY WITH RETROGRADE PYELOGRAM/URETERAL STENT PLACEMENT;  Surgeon: Sebastian Acheheodore Manny, MD;  Location: [facility];  Service: Urology;  Laterality: Left;  . Holmium laser application Left 10/13/2014    Procedure: HOLMIUM LASER APPLICATION;  Surgeon: Sebastian Acheheodore Manny, MD;  Location: [facility];  Service: Urology;  Laterality: Left;    Family History  Problem Relation Age of Onset  . Parkinson's disease Father     deceased  . Cancer Father     renal tumor  . Ulcerative colitis Sister   . Heart attack Father   . Alzheimer's disease Father    Social History:  reports that she has never smoked. She has never used smokeless tobacco. She reports that she drinks alcohol. She reports that she does not use illicit drugs.  Allergies:  Allergies  Allergen Reactions  . Codeine     headaches  . Penicillins Hives    Has patient had a PCN reaction causing immediate rash, facial/tongue/throat swelling, SOB or lightheadedness with hypotension: No Has patient had a PCN reaction causing severe rash involving mucus membranes or skin necrosis: No Has patient had a PCN reaction that required hospitalization No Has patient had a PCN reaction occurring within the last 10 years: No If all of the above answers are "NO", then may proceed with Cephalosporin use.  Medications Prior to Admission  Medication Sig Dispense Refill  . cholecalciferol (VITAMIN D) 1000 UNITS tablet Take 2,000 Units by mouth daily.     . Cyanocobalamin (VITAMIN B 12 PO) Take 5,000 mcg by mouth daily.     . Ginkgo Biloba 120 MG CAPS Take by mouth.    . Glucos-Chond-Hyal Ac-Ca Fructo (MOVE FREE JOINT HEALTH ADVANCE PO) Take by mouth.    Marland Kitchen ibuprofen (ADVIL,MOTRIN) 200 MG tablet Take 200-600 mg by mouth every 6 (six) hours as needed for moderate pain.    [place] Lanius Omega-3 500 MG CAPS Take by mouth.    .  magnesium oxide (MAG-OX) 400 MG tablet Take 400 mg by mouth 2 (two) times daily.    . Multiple Vitamins-Minerals (MULTIVITAMIN WITH MINERALS) tablet Take 1 tablet by mouth daily.    Marland Kitchen omeprazole (PRILOSEC) 40 MG capsule Take 40 mg by mouth daily.    . vitamin E 400 UNIT capsule Take 400 Units by mouth daily.    . fluticasone (FLONASE) 50 MCG/ACT nasal spray Place 1 spray into both nostrils daily.    Marland Kitchen MAGNESIUM PO Take by mouth daily.    . Multiple Vitamin (MULTIVITAMIN WITH MINERALS) TABS tablet Take 1 tablet by mouth daily.    . naproxen (NAPROSYN) 500 MG tablet Take 1 tablet (500 mg total) by mouth 2 (two) times daily as needed for mild pain, moderate pain or headache (TAKE WITH MEALS.). (Patient not taking: Reported on 08/31/2015) 20 tablet 0  . Omega-3 Fatty Acids (FISH OIL) 1000 MG CAPS Take 1 capsule by mouth daily.    . ondansetron (ZOFRAN ODT) 4 MG disintegrating tablet Take 1 tablet (4 mg total) by mouth every 8 (eight) hours as needed for nausea or vomiting. (Patient not taking: Reported on 07/13/2015) 15 tablet 0  . OVER THE COUNTER MEDICATION Omega Red Super Heart- 1 tablet daily    . OVER THE COUNTER MEDICATION Move Free Joint Health - 1 tablet daily    . oxyCODONE-acetaminophen (PERCOCET/ROXICET) 5-325 MG per tablet Take 1-2 tablets by mouth every 6 (six) hours as needed for moderate pain or severe pain (Pain). Post-operatively (Patient not taking: Reported on 07/13/2015) 30 tablet 0  . senna-docusate (SENOKOT-S) 8.6-50 MG per tablet Take 1 tablet by mouth 2 (two) times daily. While taking pain meds to prevent constipation (Patient not taking: Reported on 08/31/2015) 30 tablet 0  . sulfamethoxazole-trimethoprim (BACTRIM DS,SEPTRA DS) 800-160 MG per tablet Take 1 tablet by mouth 2 (two) times daily. X 3 days. Start day prior to next Urology appointment (Patient not taking: Reported on 07/13/2015) 6 tablet 0    No results found for this or any previous visit (from the past 48  hour(s)). No results found.  Unremarkable except for the issues mentioned in the history of present illness  Blood pressure 162/87, pulse 87, temperature 97.8 F (36.6 C), temperature source Oral, resp. rate 20, height 5' 5.5" (1.664 m), weight 95.709 kg (211 lb), last menstrual period 09/22/2006, SpO2 99 %.  The patient is awake alert and oriented. Her gait is mildly antalgic. Reflexes are 1+ at the knees trace at the ankles. Strength is 5 over 5. Assessment/Plan Impression is that of spondylolisthesis with stenosis at L4-5. The plan is for an L4-5 decompression with interbody fusion and pedicle screw fixation.  Reinaldo Meeker, MD 09/11/2015, 7:30 AM

## 2015-09-11 NOTE — Anesthesia Preprocedure Evaluation (Addendum)
Anesthesia Evaluation  Patient identified by MRN, date of birth, ID band Patient awake    Reviewed: Allergy & Precautions, NPO status , Patient's Chart, lab work & pertinent test results  Airway Mallampati: III  TM Distance: >3 FB Neck ROM: Full    Dental  (+) Teeth Intact, Chipped, Dental Advisory Given   Pulmonary neg pulmonary ROS,    breath sounds clear to auscultation       Cardiovascular negative cardio ROS   Rhythm:Regular Rate:Normal     Neuro/Psych Anxiety    GI/Hepatic GERD  Medicated,  Endo/Other    Renal/GU Renal disease     Musculoskeletal  (+) Arthritis ,   Abdominal (+) + obese,   Peds  Hematology   Anesthesia Other Findings   Reproductive/Obstetrics                            Anesthesia Physical Anesthesia Plan  ASA: II  Anesthesia Plan: General   Post-op Pain Management:    Induction: Intravenous  Airway Management Planned: Oral ETT  Additional Equipment:   Intra-op Plan:   Post-operative Plan: Extubation in OR  Informed Consent: I have reviewed the patients History and Physical, chart, labs and discussed the procedure including the risks, benefits and alternatives for the proposed anesthesia with the patient or authorized representative who has indicated his/her understanding and acceptance.   Dental advisory given  Plan Discussed with: CRNA and Anesthesiologist  Anesthesia Plan Comments:        Anesthesia Quick Evaluation

## 2015-09-12 MED ORDER — PANTOPRAZOLE SODIUM 40 MG PO TBEC
40.0000 mg | DELAYED_RELEASE_TABLET | Freq: Every day | ORAL | Status: DC
  Administered 2015-09-12 – 2015-09-13 (×2): 40 mg via ORAL
  Filled 2015-09-12 (×2): qty 1

## 2015-09-12 NOTE — Progress Notes (Signed)
Patient ID: Cynthia SimasCeleste D Li, female   DOB: [birth year], 59 y.o.   MRN: [id number] Afeb, vss No new neuro issues Wound clean and dry. Will increase activity, and plan for d/c tomorrow.

## 2015-09-13 HISTORY — PX: BACK SURGERY: SHX140

## 2015-09-13 MED ORDER — OXYCODONE-ACETAMINOPHEN 5-325 MG PO TABS: 1.0000 | ORAL_TABLET | ORAL | Status: DC | PRN

## 2015-09-13 NOTE — Discharge Summary (Signed)
Physician Discharge Summary  Patient ID: Cynthia SimasCeleste D Schweppe MRN: [id number] DOB/AGE: 02-01-1956 59 y.o.  Admit date: 09/11/2015 Discharge date: 09/13/2015  Admission Diagnoses:  Discharge Diagnoses:  Active Problems:   Lumbar stenosis with neurogenic claudication   Discharged Condition: good  Hospital Course: Surgery 2 days for 2 level plif. Did very well.arked relief of pain. Wound clean and dry. Ambuylatyed well. Home pod 2, specific instructions given.  Consults: None  Significant Diagnostic Studies: none  Treatments: surgery: L 45 plif  Discharge Exam: Blood pressure 117/65, pulse 70, temperature 98.3 F (36.8 C), temperature source Oral, resp. rate 18, height 5' 5.5" (1.664 m), weight 95.709 kg (211 lb), last menstrual period 09/22/2006, SpO2 100 %. Incision/Wound:clean and dry; no new neuro issues  Disposition: 01-Home or Self Care     Medication List    ASK your doctor about these medications        cholecalciferol 1000 UNITS tablet  Commonly known as:  VITAMIN D  Take 2,000 Units by mouth daily.     Fish Oil 1000 MG Caps  Take 1 capsule by mouth daily.     fluticasone 50 MCG/ACT nasal spray  Commonly known as:  FLONASE  Place 1 spray into both nostrils daily.     Ginkgo Biloba 120 MG Caps  Take by mouth.     ibuprofen 200 MG tablet  Commonly known as:  ADVIL,MOTRIN  Take 200-600 mg by mouth every 6 (six) hours as needed for moderate pain.     Krill Oil Omega-3 500 MG Caps  Take by mouth.     magnesium oxide 400 MG tablet  Commonly known as:  MAG-OX  Take 400 mg by mouth 2 (two) times daily.     MAGNESIUM PO  Take by mouth daily.     MOVE FREE JOINT HEALTH ADVANCE PO  Take by mouth.     multivitamin with minerals tablet  Take 1 tablet by mouth daily.     multivitamin with minerals Tabs tablet  Take 1 tablet by mouth daily.     naproxen 500 MG tablet  Commonly known as:  NAPROSYN  Take 1 tablet (500 mg total) by mouth 2 (two) times  daily as needed for mild pain, moderate pain or headache (TAKE WITH MEALS.).     omeprazole 40 MG capsule  Commonly known as:  PRILOSEC  Take 40 mg by mouth daily.     ondansetron 4 MG disintegrating tablet  Commonly known as:  ZOFRAN ODT  Take 1 tablet (4 mg total) by mouth every 8 (eight) hours as needed for nausea or vomiting.     OVER THE COUNTER MEDICATION  Omega Red Super Heart- 1 tablet daily     OVER THE COUNTER MEDICATION  Move Free Joint Health - 1 tablet daily     oxyCODONE-acetaminophen 5-325 MG tablet  Commonly known as:  PERCOCET/ROXICET  Take 1-2 tablets by mouth every 6 (six) hours as needed for moderate pain or severe pain (Pain). Post-operatively     senna-docusate 8.6-50 MG tablet  Commonly known as:  Senokot-S  Take 1 tablet by mouth 2 (two) times daily. While taking pain meds to prevent constipation     sulfamethoxazole-trimethoprim 800-160 MG tablet  Commonly known as:  BACTRIM DS,SEPTRA DS  Take 1 tablet by mouth 2 (two) times daily. X 3 days. Start day prior to next Urology appointment     VITAMIN B 12 PO  Take 5,000 mcg by mouth daily.     vitamin  E 400 UNIT capsule  Take 400 Units by mouth daily.         At home rest most of the time. Get up 9 or 10 times each day and take a 15 or 20 minute walk. No riding in the car and to your first postoperative appointment. If you have neck surgery you may shower from the chest down starting on the third postoperative day. If you had back surgery he may start showering on the third postoperative day with saran wrap wrapped around your incisional area 3 times. After the shower remove the saran wrap. Take pain medicine as needed and other medications as instructed. Call my office for an appointment.  SignedReinaldo Meeker, MD 09/13/2015, 12:30 PM

## 2015-09-13 NOTE — Progress Notes (Signed)
Pt doing well. Pt and friend given D/C instructions with Rx, verbal understanding was provided. Pt's incision is stained with a minimal amount of drainage. Pt's IV and Hemovac were removed prior to D/C. Pt D/C'd home via wheelchair @ 1325 per MD order. Pt is stable @ D/C and has no other needs at this time. Rema FendtAshley Brittanni Cariker, RN

## 2015-09-13 NOTE — Discharge Instructions (Signed)
Wound Care °Keep incision covered and dry for one week. °You may shower from the neck down. °You may remove outer bandage after one week.  °Do not put any creams, lotions, or ointments on incision. °Leave steri-strips on neck.  They will fall off by themselves. °Activity °Walk each and every day, increasing distance each day. °No lifting greater than 5 lbs.  Avoid bending, arching, and twisting. °No driving or riding in car until further notice at follow up appointment. °If provided with back brace, wear when out of bed.  It is not necessary to wear brace in bed. °Diet °Resume your normal diet.  °Return to Work °Will be discussed at you follow up appointment. °Call Your Doctor If Any of These Occur °Redness, drainage, or swelling at the wound.  °Temperature greater than 101 degrees. °Severe pain not relieved by pain medication. °Incision starts to come apart. °Follow Up Appt °Call today for appointment in 1-2 weeks ([phone]) or for problems.  If you have any hardware placed in your spine, you will need an x-ray before your appointment. ° °Spinal Fusion °Spinal fusion is a procedure to make 2 or more of the bones in your spinal column (vertebrae) grow together (fuse). This procedure stops movement between the vertebrae and can relieve pain and prevent deformity.  °Spinal fusion is used to treat the following conditions: °· Fractures of the spine. °· Herniated disk (the spongy material [cartilage] between the vertebrae). °· Abnormal curvatures of the spine, such as scoliosis or kyphosis. °· A weak or an unstable spine, caused by infections or tumor. °RISKS AND COMPLICATIONS °Complications associated with spinal fusion are rare, but they can occur. Possible complications include: °· Bleeding. °· Infection near the incision. °· Nerve damage. Signs of nerve damage are back pain, pain in one or both legs, weakness, or numbness. °· Spinal fluid leakage. °· Blood clot in your leg, which can move to your  lungs. °· Difficulty controlling urination or bowel movements. °BEFORE THE PROCEDURE °· A medical evaluation will be done. This will include a physical exam, blood tests, and imaging exams. °· You will talk with an anesthesiologist. This is the person who will be in charge of the anesthesia during the procedure. Spinal fusion usually requires that you are asleep during the procedure (general anesthesia). °· You will need to stop taking certain medicines, particularly those associated with an increased risk of bleeding. Ask your caregiver about changing or stopping your regular medicines. °· If you smoke, you will need to stop at least 2 weeks before the procedure. Smoking can slow down the healing process, especially fusion of the vertebrae, and increase the risk of complications. °· Do not eat or drink anything for at least 8 hours before the procedure. °PROCEDURE  °A cut (incision) is made over the vertebrae that will be fused. The back muscles are separated from the vertebrae. If you are having this procedure to treat a herniated disk, the disc material pressing on the nerve root is removed (decompression). The area where the disk is removed is then filled with extra bone. Bone from another part of your body (autogenous bone) or bone from a bone donor (allograft bone) may be used. The extra bone promotes fusion between the vertebrae. Sometimes, specific medicines are added to the fusion area to promote bone healing. In most cases, screws and rods or metal plates will be used to attach the vertebrae to stabilize them while they fuse.  °AFTER THE PROCEDURE  °· You will stay in   a recovery area until the anesthesia has worn off. Your blood pressure and pulse will be checked frequently. °· You will be given antibiotics to prevent infection. °· You may continue to receive fluids through an intravenous (IV) tube while you are still in the hospital. °· Pain after surgery is normal. You will be given pain medicine. °· You  will be taught how to move correctly and how to stand and walk. While in bed, you will be instructed to turn frequently, using a "log rolling" technique, in which the entire body is moved without twisting the back. °  °This information is not intended to replace advice given to you by your health care provider. Make sure you discuss any questions you have with your health care provider. °  °Document Released: 06/07/2003 Document Revised: 12/01/2011 Document Reviewed: 02/21/2015 °Elsevier Interactive Patient Education ©2016 [employer] ° °

## 2015-09-20 ENCOUNTER — Encounter: Payer: Self-pay | Admitting: Obstetrics & Gynecology

## 2015-09-20 NOTE — Progress Notes (Signed)
Fecal occult test given at [facility] not returned.  Letter written to be mailed 09/20/15.

## 2016-07-14 ENCOUNTER — Other Ambulatory Visit: Payer: Self-pay | Admitting: Internal Medicine

## 2016-07-14 DIAGNOSIS — Z1231 Encounter for screening mammogram for malignant neoplasm of breast: Secondary | ICD-10-CM

## 2016-08-05 ENCOUNTER — Ambulatory Visit
Admission: RE | Admit: 2016-08-05 | Discharge: 2016-08-05 | Disposition: A | Discharge status: Home / Self Care | Payer: BC Managed Care – PPO | Source: Ambulatory Visit | Attending: Internal Medicine | Admitting: Internal Medicine

## 2016-08-05 DIAGNOSIS — Z1231 Encounter for screening mammogram for malignant neoplasm of breast: Secondary | ICD-10-CM

## 2016-08-08 ENCOUNTER — Other Ambulatory Visit: Payer: Self-pay | Admitting: Internal Medicine

## 2016-08-08 DIAGNOSIS — R928 Other abnormal and inconclusive findings on diagnostic imaging of breast: Secondary | ICD-10-CM

## 2016-08-18 ENCOUNTER — Ambulatory Visit
Admission: RE | Admit: 2016-08-18 | Discharge: 2016-08-18 | Disposition: A | Discharge status: Home / Self Care | Payer: BC Managed Care – PPO | Source: Ambulatory Visit | Attending: Internal Medicine | Admitting: Internal Medicine

## 2016-08-18 DIAGNOSIS — R928 Other abnormal and inconclusive findings on diagnostic imaging of breast: Secondary | ICD-10-CM

## 2016-08-23 IMAGING — CT CT RENAL STONE PROTOCOL
1 series · 14 of 20 positions shown, 19 images · non-contrast
Comparison: None.

CLINICAL DATA: Left flank pain

EXAM:
CT ABDOMEN AND PELVIS WITHOUT CONTRAST
TECHNIQUE: Multidetector CT imaging of the abdomen and pelvis was performed
following the standard protocol without IV contrast.

[Series 4: lung · axial · 0.72mm/px · z∈[-141,-56]mm · 14 of 20 slices shown, 19 images]
[im 2/20  soft-tissue]
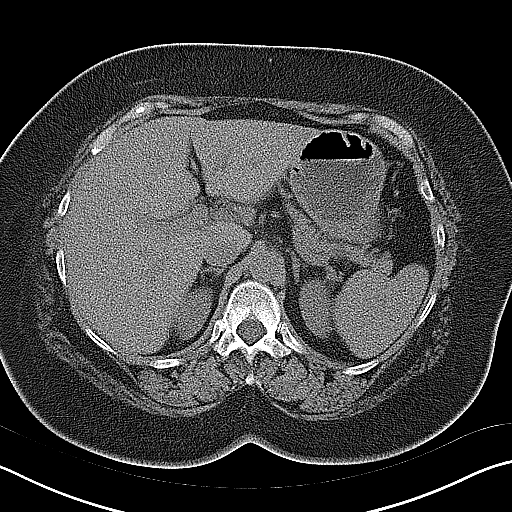
[im 2/20  bone]
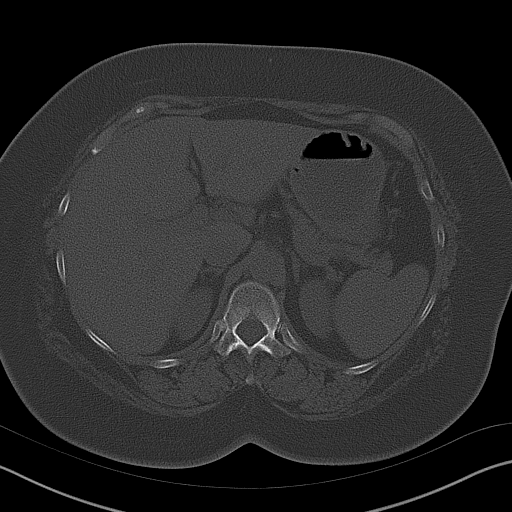
[im 4/20  soft-tissue]
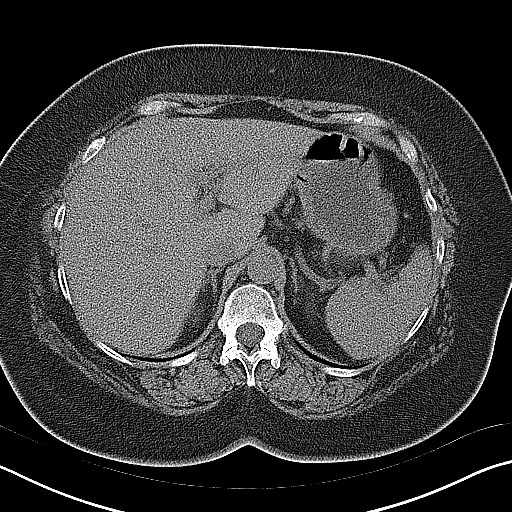
[im 5/20  soft-tissue]
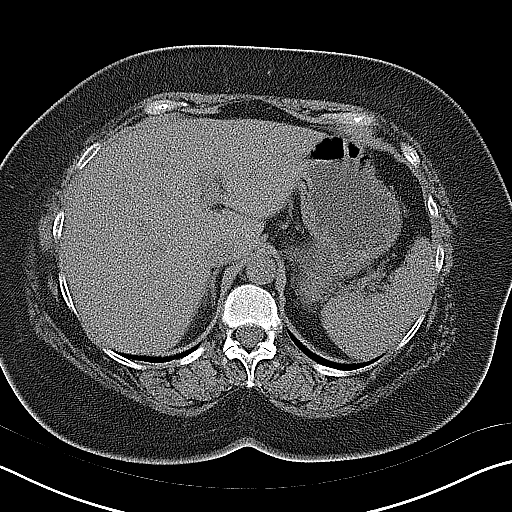
[im 6/20  soft-tissue]
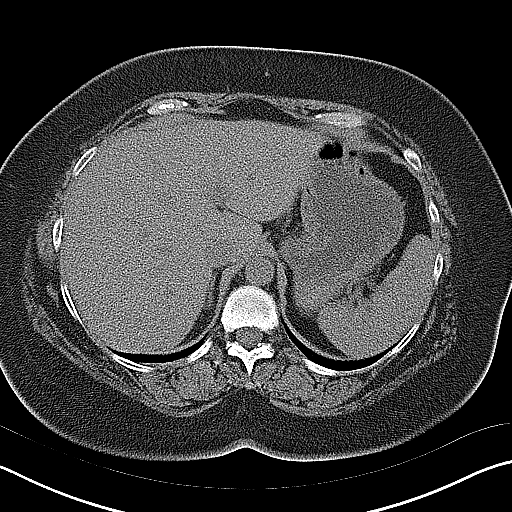
[im 8/20  soft-tissue]
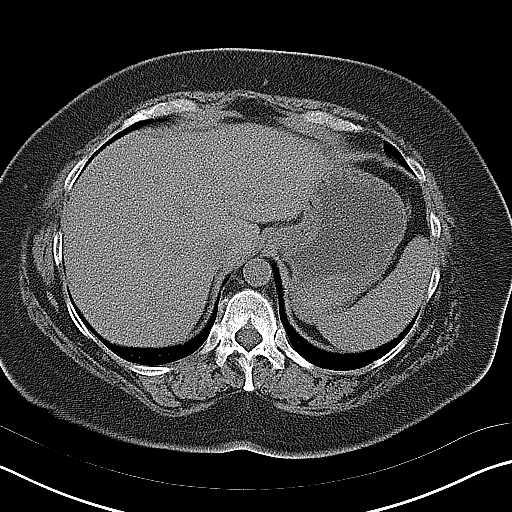
[im 9/20  soft-tissue]
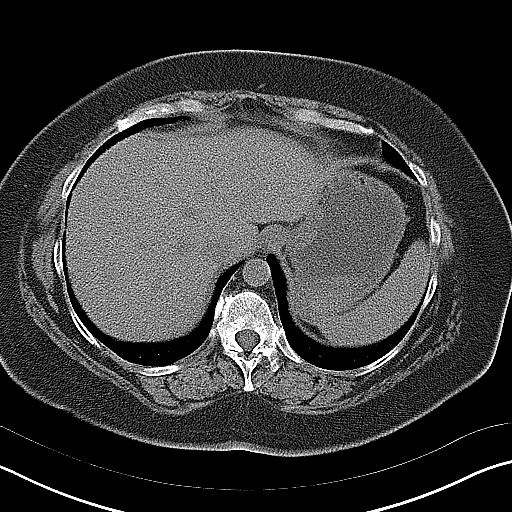
[im 11/20  soft-tissue]
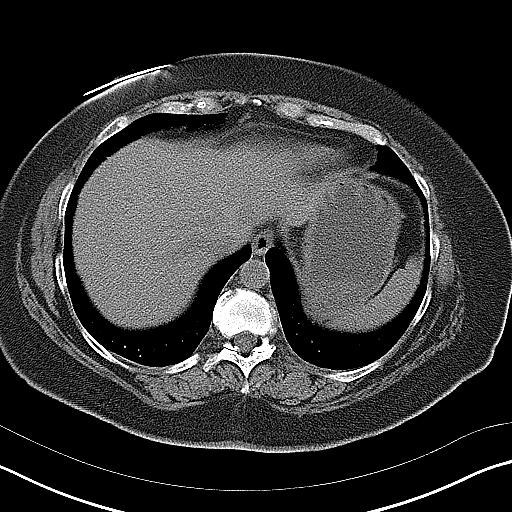
[im 12/20  soft-tissue]
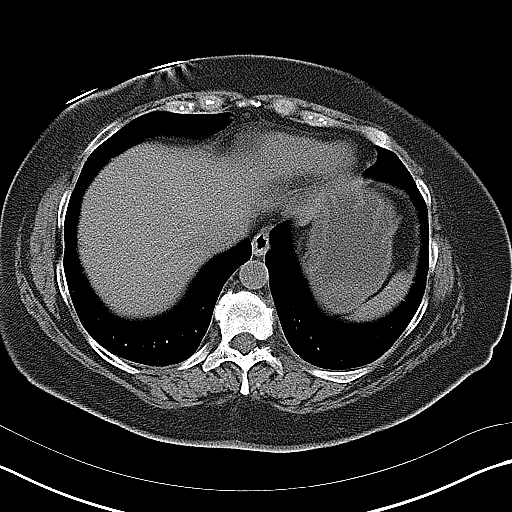
[im 13/20  soft-tissue]
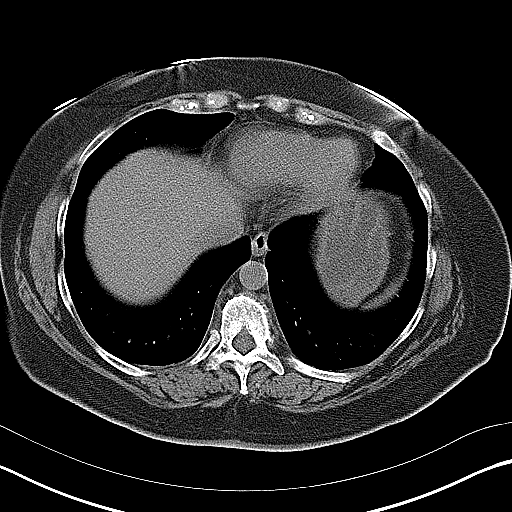
[im 13/20  bone]
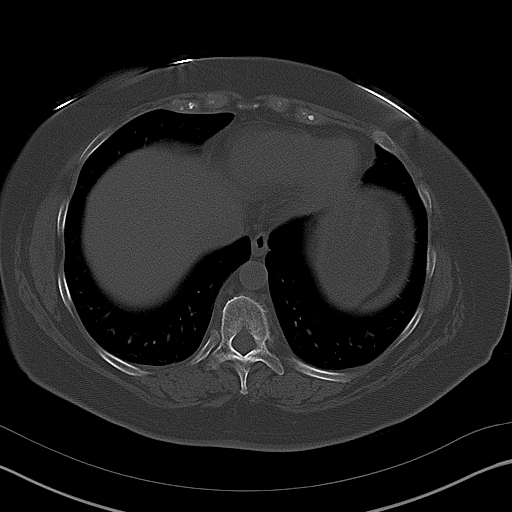
[im 15/20  soft-tissue]
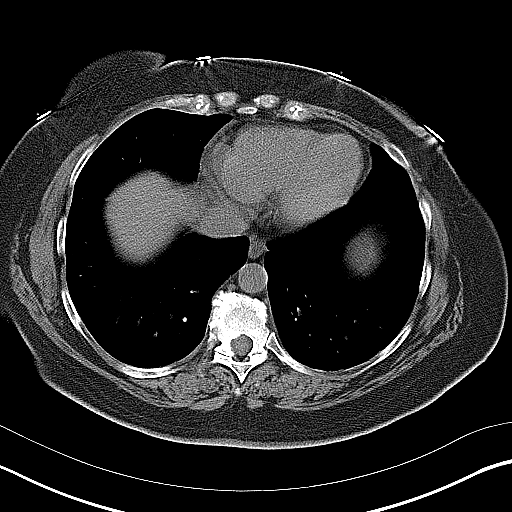
[im 16/20  soft-tissue]
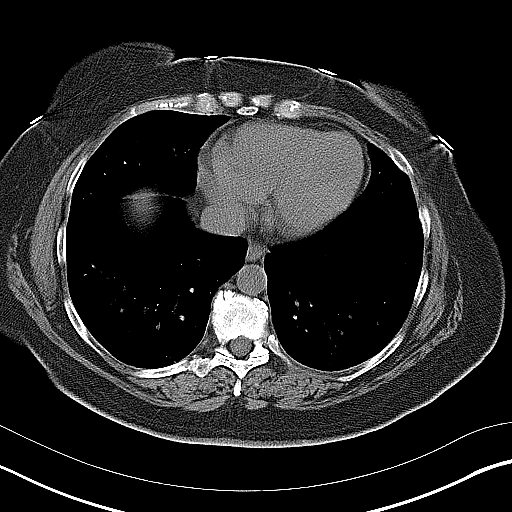
[im 16/20  lung]
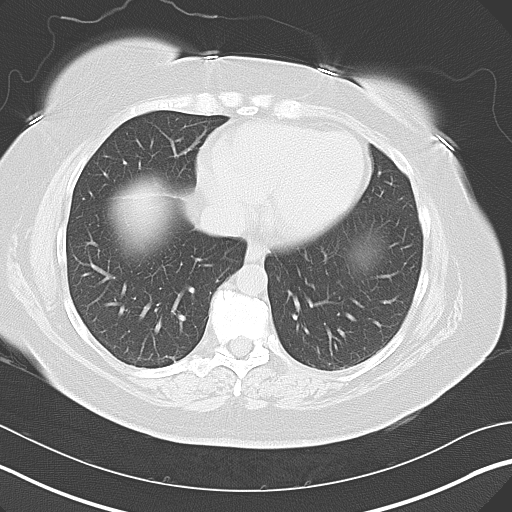
[im 17/20  soft-tissue]
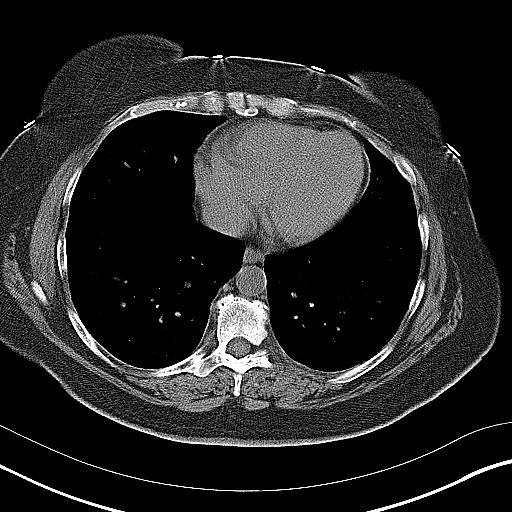
[im 17/20  lung]
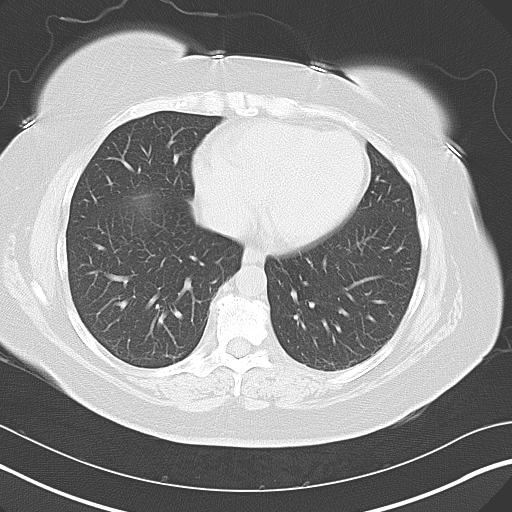
[im 18/20  lung]
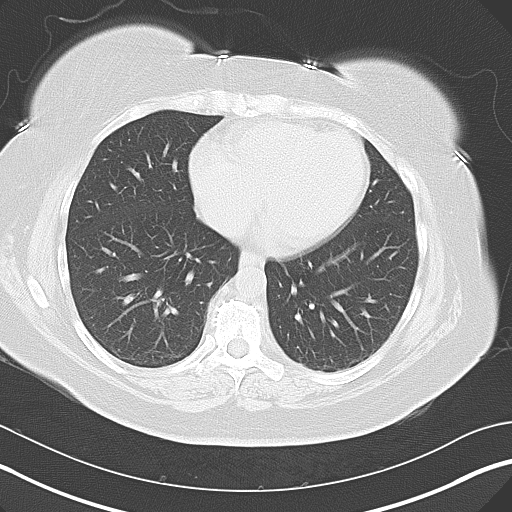
[im 19/20  soft-tissue]
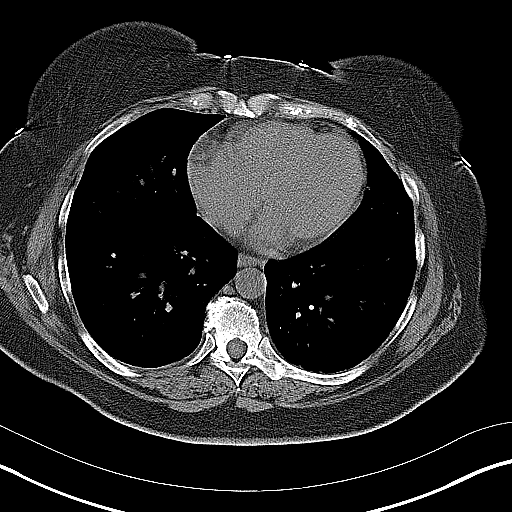
[im 19/20  lung]
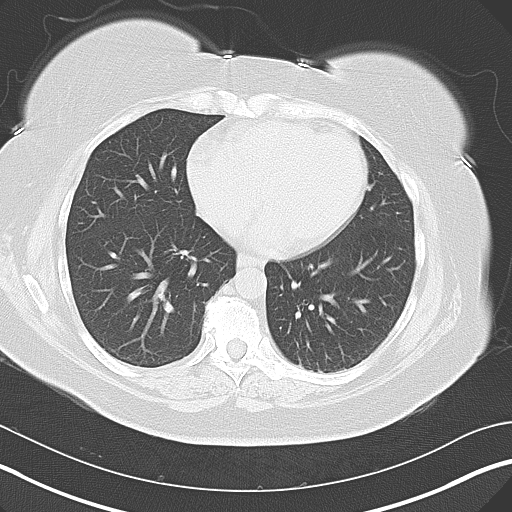

[14 of 20 positions shown; findings below may reference images not displayed]

FINDINGS: The lung bases are clear.

There is a 18 x 10 mm left UPJ calculus with mild surrounding
inflammatory changes and moderate left hydronephrosis. There is no
other renal, ureteral or bladder calculus. The kidneys are symmetric
in size without evidence for exophytic mass. The bladder is
unremarkable.

The liver demonstrates no focal abnormality. The gallbladder is
surgically absent. The spleen demonstrates no focal abnormality. The
adrenal glands and pancreas are normal.

The unopacified stomach, duodenum, small intestine and large
intestine are unremarkable, but evaluation is limited by lack of
oral contrast. There is a normal caliber appendix in the right lower
quadrant without periappendiceal inflammatory changes. There is no
pneumoperitoneum, pneumatosis, or portal venous gas. There is no
abdominal or pelvic free fluid. There is no lymphadenopathy.

The abdominal aorta is normal in caliber .

The osseous structures are unremarkable.Moderate broad-based disc
bulge at L4-5. Mild broad-based disc bulge at L3-4. Severe
degenerative disc disease at L5-S1. Severe bilateral facet
arthropathy at L4-5.
IMPRESSION: 1. There is a 18 x 10 mm left UPJ calculus with mild surrounding
inflammatory changes and moderate left hydronephrosis.
2. Normal appendix.

## 2016-10-31 ENCOUNTER — Ambulatory Visit: Payer: BC Managed Care – PPO | Admitting: Obstetrics & Gynecology

## 2016-11-20 NOTE — Progress Notes (Signed)
61 y.o. [id number] SingleCaucasianF here for annual exam.  Has had back surgery since I saw her.  Still having some back pain issues.  Feels she went back to work too soon.    Denies vaginal bleeding.   PCP:  Dr. Sharon Mt.  Last appt was in December.  Patient's last menstrual period was 09/23/2007.          Sexually active: No.  The current method of family planning is status post hysterectomy.    Exercising: Yes.    back stretches Smoker:  no  Health Maintenance: Pap:  06/22/06 negative  History of abnormal Pap:  no MMG:  08/18/16 BIRADS 1 negative  Colonoscopy:  never BMD:   10/05/09 normal  TDaP:  Unsure, but with PCP Pneumonia vaccine(s):  never Zostavax:   never Hep C testing: drawn today Screening Labs: PCP, Hb today: PCP   reports that she has never smoked. She has never used smokeless tobacco. She reports that she drinks alcohol. She reports that she does not use drugs.  Past Medical History:  Diagnosis Date  . Arthritis   . Complication of anesthesia    once when teeth were numb made heart race   . Family history of adverse reaction to anesthesia    father- slow to wake up   . GERD (gastroesophageal reflux disease)   . Hiatal hernia   . IBS (irritable bowel syndrome)    age 61  . Kidney stone 3/14   no surgery  . Lumbar disc disease   . Panic disorder   . Pinched nerve    some DDD    Past Surgical History:  Procedure Laterality Date  . BACK SURGERY  09/13/2015   Back Fusion- [facility]   . CHOLECYSTECTOMY  age 79  . CYSTOSCOPY W/ URETERAL STENT PLACEMENT Left 10/13/2014   Procedure: CYSTOSCOPY WITH RETROGRADE PYELOGRAM/URETERAL STENT PLACEMENT;  Surgeon: Sebastian Ache, MD;  Location: [facility];  Service: Urology;  Laterality: Left;  . HOLMIUM LASER APPLICATION Left 10/13/2014   Procedure: HOLMIUM LASER APPLICATION;  Surgeon: Sebastian Ache, MD;  Location: [facility];  Service: Urology;  Laterality: Left;  . KNEE ARTHROSCOPY     right  . NEPHROLITHOTOMY Left  10/13/2014   Procedure: NEPHROLITHOTOMY PERCUTANEOUS WITH SURGEON ACCESS;  Surgeon: Sebastian Ache, MD;  Location: [facility];  Service: Urology;  Laterality: Left;  . TOTAL ABDOMINAL HYSTERECTOMY  09/2006    Current Outpatient Prescriptions  Medication Sig Dispense Refill  . ALPRAZolam (XANAX) 0.5 MG tablet TAKE 1 TABLET EVERY DAY AS NEEDED FOR PANIC ATTACKS  0  . cholecalciferol (VITAMIN D) 1000 UNITS tablet Take 2,000 Units by mouth daily.     . Cyanocobalamin (VITAMIN B 12 PO) Take 5,000 mcg by mouth daily.     . Ginkgo Biloba 120 MG CAPS Take by mouth.    . IBUPROFEN PO Take by mouth daily.    [place] Lanius Omega-3 500 MG CAPS Take by mouth.    . magnesium oxide (MAG-OX) 400 MG tablet Take 400 mg by mouth 2 (two) times daily.    . Multiple Vitamin (MULTIVITAMIN WITH MINERALS) TABS tablet Take 1 tablet by mouth daily.    . Omega-3 Fatty Acids (FISH OIL) 1000 MG CAPS Take 1 capsule by mouth daily.    Marland Kitchen omeprazole (PRILOSEC) 40 MG capsule Take 40 mg by mouth daily.    . ondansetron (ZOFRAN ODT) 4 MG disintegrating tablet Take 1 tablet (4 mg total) by mouth every 8 (eight) hours as needed  for nausea or vomiting. 15 tablet 0  . OVER THE COUNTER MEDICATION Omega Red Super Heart- 1 tablet daily    . OVER THE COUNTER MEDICATION Move Free Joint Health - 1 tablet daily    . pantoprazole (PROTONIX) 40 MG tablet     . Probiotic Product (PROBIOTIC PO) Take by mouth daily.    . vitamin E 400 UNIT capsule Take 400 Units by mouth daily.    . Wheat Dextrin (BENEFIBER) POWD Take by mouth daily.     No current facility-administered medications for this visit.     Family History  Problem Relation Age of Onset  . Parkinson's disease Father     deceased  . Cancer Father     renal tumor  . Heart attack Father   . Alzheimer's disease Father   . Ulcerative colitis Sister     ROS:  Pertinent items are noted in HPI.  Otherwise, a comprehensive ROS was negative.  Exam:   BP 118/70 (BP Location: Right Arm,  Patient Position: Sitting, Cuff Size: Normal)   Pulse 68   Resp 12   Ht 5\' 5"  (1.651 m)   Wt 220 lb (99.8 kg)   LMP 09/23/2007   BMI 36.61 kg/m   Weight change: +6#    Height: 5\' 5"  (165.1 cm)  Ht Readings from Last 3 Encounters:  11/24/16 5\' 5"  (1.651 m)  09/11/15 5' 5.5" (1.664 m)  08/31/15 5' 5.5" (1.664 m)    General appearance: alert, cooperative and appears stated age Head: Normocephalic, without obvious abnormality, atraumatic Neck: no adenopathy, supple, symmetrical, trachea midline and thyroid normal to inspection and palpation Lungs: clear to auscultation bilaterally Breasts: normal appearance, no masses or tenderness Heart: regular rate and rhythm Abdomen: soft, non-tender; bowel sounds normal; no masses,  no organomegaly Extremities: extremities normal, atraumatic, no cyanosis or edema Skin: Skin color, texture, turgor normal. No rashes or lesions Lymph nodes: Cervical, supraclavicular, and axillary nodes normal. No abnormal inguinal nodes palpated Neurologic: Grossly normal  Pelvic: External genitalia:  no lesions              Urethra:  normal appearing urethra with no masses, tenderness or lesions              Bartholins and Skenes: normal                 Vagina: normal appearing vagina with normal color and discharge, no lesions              Cervix: absent              Pap taken: No. Bimanual Exam:  Uterus:  uterus absent              Adnexa: no mass, fullness, tenderness               Rectovaginal: Confirms               Anus:  normal sphincter tone, no lesions  Chaperone was present for exam.  A:  Well Woman with normal exam H/O TAH Vasomotor symptoms Back surgery 12/16 H/O elevated lipids Chronic back pain Inner thigh yeast changes  P:   Mammogram guidelines.  Order for BMD placed.   Will do with next MMG.   pap smear not indicated Declines colonoscopy Lab work done with Dr. Sharon Mt Hep C antibody obtained today Release for vaccine records will  be signed Trial of cymbalta 30mg  daily.  #30/2RF.  Follow up 4 weeks. rx  for nystatin sent to pharmacy.  Will recheck at follow up. return annually or prn

## 2016-11-24 ENCOUNTER — Other Ambulatory Visit: Payer: Self-pay | Admitting: Obstetrics & Gynecology

## 2016-11-24 ENCOUNTER — Ambulatory Visit (INDEPENDENT_AMBULATORY_CARE_PROVIDER_SITE_OTHER): Payer: BC Managed Care – PPO | Admitting: Obstetrics & Gynecology

## 2016-11-24 ENCOUNTER — Encounter: Payer: Self-pay | Admitting: Obstetrics & Gynecology

## 2016-11-24 VITALS — BP 118/70 | HR 68 | Resp 12 | Ht 65.0 in | Wt 220.0 lb

## 2016-11-24 DIAGNOSIS — E2839 Other primary ovarian failure: Secondary | ICD-10-CM

## 2016-11-24 DIAGNOSIS — Z01419 Encounter for gynecological examination (general) (routine) without abnormal findings: Secondary | ICD-10-CM

## 2016-11-24 DIAGNOSIS — Z205 Contact with and (suspected) exposure to viral hepatitis: Secondary | ICD-10-CM | POA: Diagnosis not present

## 2016-11-24 MED ORDER — NYSTATIN 100000 UNIT/GM EX CREA: 1.0000 "application " | TOPICAL_CREAM | Freq: Two times a day (BID) | CUTANEOUS | 1 refills | Status: DC

## 2016-11-24 MED ORDER — DULOXETINE HCL 30 MG PO CPEP: 30.0000 mg | ORAL_CAPSULE | Freq: Every day | ORAL | 1 refills | Status: DC

## 2016-11-25 LAB — HEPATITIS C ANTIBODY: HCV Ab: NEGATIVE

## 2016-11-27 ENCOUNTER — Telehealth: Payer: Self-pay

## 2016-11-27 NOTE — Telephone Encounter (Signed)
Left message for patient to call Cynthia Li back.  

## 2016-11-28 NOTE — Telephone Encounter (Signed)
Spoke with patient concerning her Cymbalta and vitamins. See Lab note.

## 2016-12-09 ENCOUNTER — Telehealth: Payer: Self-pay | Admitting: *Deleted

## 2016-12-09 NOTE — Telephone Encounter (Signed)
-----   Message from Jerene BearsMary S Miller, MD sent at 12/09/2016 10:18 AM EDT ----- Regarding: last tdap Can you please let pt know I got her vaccination records from Dr. Sharon MtHolwerta and her last tdap was 05/10/12.  This is good for 10 years.  I have documented this now in her records so I won't keep asking her about this.  Thanks.  Cynthia ChessmanSuzanne

## 2016-12-09 NOTE — Telephone Encounter (Signed)
Left message per DPR of message as seen below from Dr. Hyacinth MeekerMiller. Advised patient to return call if she had any questions.   Routing to provider for final review. Patient agreeable to disposition. Will close encounter.

## 2016-12-22 ENCOUNTER — Ambulatory Visit (INDEPENDENT_AMBULATORY_CARE_PROVIDER_SITE_OTHER): Payer: BC Managed Care – PPO | Admitting: Obstetrics & Gynecology

## 2016-12-22 ENCOUNTER — Telehealth: Payer: Self-pay

## 2016-12-22 ENCOUNTER — Encounter: Payer: Self-pay | Admitting: Obstetrics & Gynecology

## 2016-12-22 VITALS — BP 110/70 | HR 68 | Resp 12 | Ht 65.0 in | Wt 219.0 lb

## 2016-12-22 DIAGNOSIS — M545 Low back pain, unspecified: Secondary | ICD-10-CM

## 2016-12-22 DIAGNOSIS — G8929 Other chronic pain: Secondary | ICD-10-CM | POA: Diagnosis not present

## 2016-12-22 MED ORDER — DULOXETINE HCL 40 MG PO CPEP: 40.0000 mg | ORAL_CAPSULE | Freq: Every day | ORAL | 0 refills | Status: DC

## 2016-12-22 NOTE — Progress Notes (Signed)
GYNECOLOGY  VISIT   HPI: 61 y.o. [id number] Single Caucasian female here for follow up after starting cymbalta .  She is having significant back issues and was having difficulty getting [facility] to call her back.  She decided to try another option and now has an appt in a few weeks at [facility].  She is also in physical therapy which seems to have made some of her pain worse.  She does not want to be on pain medication if at all possible.  She is walking today with a little limp and bending over some as that relives some of the pain.  She is not sure the cymbalta has helped but she states it certain hasn't hurt.  She's afraid to stop taking it and would like to increased the dosage as she knows  is low.  She did have some side effects the first two weeks but this has resolved now and she does not feel any side effects.  D/W pt possible dosage changes.      GYNECOLOGIC HISTORY: Patient's last menstrual period was 09/23/2007. Contraception: hysterectomy Menopausal hormone therapy: none  Patient Active Problem List   Diagnosis Date Noted  . Lumbar stenosis with neurogenic claudication 09/11/2015  . Renal stone 10/13/2014    Past Medical History:  Diagnosis Date  . Arthritis   . Complication of anesthesia    once when teeth were numb made heart race   . Family history of adverse reaction to anesthesia    father- slow to wake up   . GERD (gastroesophageal reflux disease)   . Hiatal hernia   . IBS (irritable bowel syndrome)    age 60  . Kidney stone 3/14   no surgery  . Lumbar disc disease   . Panic disorder   . Pinched nerve    some DDD    Past Surgical History:  Procedure Laterality Date  . BACK SURGERY  09/13/2015   Back Fusion- [facility]   . CHOLECYSTECTOMY  age 70  . CYSTOSCOPY W/ URETERAL STENT PLACEMENT Left 10/13/2014   Procedure: CYSTOSCOPY WITH RETROGRADE PYELOGRAM/URETERAL STENT PLACEMENT;  Surgeon: Sebastian Ache, MD;  Location: [facility];   Service: Urology;  Laterality: Left;  . HOLMIUM LASER APPLICATION Left 10/13/2014   Procedure: HOLMIUM LASER APPLICATION;  Surgeon: Sebastian Ache, MD;  Location: [facility];  Service: Urology;  Laterality: Left;  . KNEE ARTHROSCOPY     right  . NEPHROLITHOTOMY Left 10/13/2014   Procedure: NEPHROLITHOTOMY PERCUTANEOUS WITH SURGEON ACCESS;  Surgeon: Sebastian Ache, MD;  Location: [facility];  Service: Urology;  Laterality: Left;  . TOTAL ABDOMINAL HYSTERECTOMY  09/2006    MEDS:  Reviewed in EPIC and UTD  ALLERGIES: Codeine and Penicillins  Family History  Problem Relation Age of Onset  . Parkinson's disease Father     deceased  . Cancer Father     renal tumor  . Heart attack Father   . Alzheimer's disease Father   . Ulcerative colitis Sister     SH:  Single, non smoker  Review of Systems  Musculoskeletal: Positive for back pain (with associated difficulty walking).    PHYSICAL EXAMINATION:    BP 110/70 (BP Location: Right Arm, Patient Position: Sitting, Cuff Size: Normal)   Pulse 68   Resp 12   Ht  (1.651 m)   Wt 219 lb (99.3 kg)   LMP 09/23/2007   BMI 36.44 kg/m     General appearance: alert, cooperative and appears stated age No other  exam performed  Chaperone was present for exam.  Assessment: Chronic back pain  Plan: Increase Cymbalta to  daily.  #30/1RF.  She will call and give update in a month.  Hopefully, she will be moving ahead at that point with treatment for her back that will help.   Pt knows to call if needs help with additional referrals.   ~15 minutes spent with patient >50% of time was in face to face discussion of above.

## 2016-12-22 NOTE — Telephone Encounter (Signed)
Spoke with Morrie Sheldon at [facility]. Rx for Duloxetine 30 mg will be placed back as the patient's dose changed today to 40 mg. Morrie Sheldon will go ahead and fill Duloxetine 40 mg tablets now.  Routing to provider for final review. Patient agreeable to disposition. Will close encounter.

## 2016-12-23 ENCOUNTER — Ambulatory Visit: Payer: BC Managed Care – PPO | Admitting: Obstetrics & Gynecology

## 2018-02-19 ENCOUNTER — Encounter: Payer: Self-pay | Admitting: Obstetrics & Gynecology

## 2018-02-19 ENCOUNTER — Other Ambulatory Visit: Payer: Self-pay

## 2018-02-19 ENCOUNTER — Other Ambulatory Visit: Payer: Self-pay | Admitting: Obstetrics & Gynecology

## 2018-02-19 ENCOUNTER — Ambulatory Visit: Payer: BC Managed Care – PPO | Admitting: Obstetrics & Gynecology

## 2018-02-19 VITALS — BP 126/68 | HR 92 | Resp 16 | Ht 65.0 in | Wt 221.2 lb

## 2018-02-19 DIAGNOSIS — E2839 Other primary ovarian failure: Secondary | ICD-10-CM | POA: Diagnosis not present

## 2018-02-19 DIAGNOSIS — Z139 Encounter for screening, unspecified: Secondary | ICD-10-CM

## 2018-02-19 NOTE — Progress Notes (Signed)
62 y.o. [id number] SingleCaucasianF here for annual exam.  Doing well.  Denies vaginal bleeding.  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    Continues to have back pain issues.  Did have injection last year.  This didn't really help.  Did see specialist at [facility].    Patient's last menstrual period was 09/23/2007.          Sexually active: No.  The current method of family planning is status post hysterectomy.    Exercising: Yes.    PT, Gym Smoker:  no  Health Maintenance: Pap:  2007 Normal  History of abnormal Pap:  no MMG:  08/18/16 Diagnostic Left BIRADS1:Neg. F/u 1 year Colonoscopy:  Never.  Did stool test for blood with Dr. Sharon Mt. BMD:   10/05/09 Normal TDaP:  2013 Pneumonia vaccine(s):  n/a Shingrix:   No Hep C testing: 11/24/16 Neg  Screening Labs: PCP   reports that she has never smoked. She has never used smokeless tobacco. She reports that she drinks alcohol. She reports that she does not use drugs.  Past Medical History:  Diagnosis Date  . Arthritis   . Complication of anesthesia    once when teeth were numb made heart race   . Family history of adverse reaction to anesthesia    father- slow to wake up   . GERD (gastroesophageal reflux disease)   . Hiatal hernia   . IBS (irritable bowel syndrome)    age 36  . Kidney stone 3/14   no surgery  . Lumbar disc disease   . Panic disorder   . Pinched nerve    some DDD    Past Surgical History:  Procedure Laterality Date  . BACK SURGERY  09/13/2015   Back Fusion- [facility]   . CHOLECYSTECTOMY  age 13  . CYSTOSCOPY W/ URETERAL STENT PLACEMENT Left 10/13/2014   Procedure: CYSTOSCOPY WITH RETROGRADE PYELOGRAM/URETERAL STENT PLACEMENT;  Surgeon: Sebastian Ache, MD;  Location: [facility];  Service: Urology;  Laterality: Left;  . HOLMIUM LASER APPLICATION Left 10/13/2014   Procedure: HOLMIUM LASER APPLICATION;  Surgeon: Sebastian Ache, MD;  Location: [facility];  Service: Urology;  Laterality: Left;  . KNEE ARTHROSCOPY     right   . NEPHROLITHOTOMY Left 10/13/2014   Procedure: NEPHROLITHOTOMY PERCUTANEOUS WITH SURGEON ACCESS;  Surgeon: Sebastian Ache, MD;  Location: [facility];  Service: Urology;  Laterality: Left;  . TOTAL ABDOMINAL HYSTERECTOMY  09/2006    Current Outpatient Medications  Medication Sig Dispense Refill  . ALPRAZolam (XANAX) 0.5 MG tablet TAKE 1 TABLET EVERY DAY AS NEEDED FOR PANIC ATTACKS  0  . cholecalciferol (VITAMIN D) 1000 UNITS tablet Take 2,000 Units by mouth daily.     . Cyanocobalamin (VITAMIN B 12 PO) Take 5,000 mcg by mouth daily.     . IBUPROFEN PO Take by mouth daily.    . magnesium oxide (MAG-OX) 400 MG tablet Take 400 mg by mouth 2 (two) times daily.    . Multiple Vitamin (MULTIVITAMIN WITH MINERALS) TABS tablet Take 1 tablet by mouth daily.    Marland Kitchen nystatin cream (MYCOSTATIN) Apply 1 application topically 2 (two) times daily. Apply to affected area BID for up to 14 days. 30 g 1  . Omega-3 Fatty Acids (FISH OIL) 1000 MG CAPS Take 1 capsule by mouth daily.    Marland Kitchen OVER THE COUNTER MEDICATION Move Free Joint Health - 1 tablet daily    . Probiotic Product (PROBIOTIC PO) Take by mouth daily.    . vitamin E 400 UNIT  capsule Take 400 Units by mouth daily.    . Wheat Dextrin (BENEFIBER) POWD Take by mouth daily.     No current facility-administered medications for this visit.     Family History  Problem Relation Age of Onset  . Parkinson's disease Father        deceased  . Cancer Father        renal tumor  . Heart attack Father   . Alzheimer's disease Father   . Ulcerative colitis Sister     Review of Systems  Musculoskeletal: Positive for myalgias.  All other systems reviewed and are negative.   Exam:   BP 126/68 (BP Location: Left Arm, Patient Position: Sitting, Cuff Size: Large)   Pulse 92   Resp 16   Ht  (1.651 m)   Wt 221 lb 3.2 oz (100.3 kg)   LMP 09/23/2007   BMI 36.81 kg/m    Height:  (165.1 cm)  Ht Readings from Last 3 Encounters:  02/19/18  (1.651 m)   12/22/16  (1.651 m)  11/24/16  (1.651 m)    General appearance: alert, cooperative and appears stated age Head: Normocephalic, without obvious abnormality, atraumatic Neck: no adenopathy, supple, symmetrical, trachea midline and thyroid normal to inspection and palpation Lungs: clear to auscultation bilaterally Breasts: normal appearance, no masses or tenderness Heart: regular rate and rhythm Abdomen: soft, non-tender; bowel sounds normal; no masses,  no organomegaly Extremities: extremities normal, atraumatic, no cyanosis or edema Skin: Skin color, texture, turgor normal. No rashes or lesions Lymph nodes: Cervical, supraclavicular, and axillary nodes normal. No abnormal inguinal nodes palpated Neurologic: Grossly normal   Pelvic: External genitalia:  no lesions              Urethra:  normal appearing urethra with no masses, tenderness or lesions              Bartholins and Skenes: normal                 Vagina: normal appearing vagina with normal color and discharge, no lesions              Cervix: absent              Pap taken: No. Bimanual Exam:  Uterus:  uterus absent              Adnexa: no mass, fullness, tenderness               Rectovaginal: Confirms               Anus:  normal sphincter tone, no lesions  Chaperone was present for exam.  A:  Well Woman with normal exam PMP, no HRT H/o TAH Chronic low back pain Elevated lipids  P:   Mammogram and BMD will be scheduled for pt.  Overdue. pap smear not obtained today Lab work UTD with Dr. Sharon Mt Declines colonoscopy.  Did stool test with Dr. Sharon Mt. return annually or prn

## 2018-04-05 ENCOUNTER — Telehealth: Payer: Self-pay | Admitting: Obstetrics & Gynecology

## 2018-04-05 ENCOUNTER — Ambulatory Visit
Admission: RE | Admit: 2018-04-05 | Discharge: 2018-04-05 | Disposition: A | Discharge status: Home / Self Care | Payer: BC Managed Care – PPO | Source: Ambulatory Visit | Attending: Obstetrics & Gynecology | Admitting: Obstetrics & Gynecology

## 2018-04-05 DIAGNOSIS — Z139 Encounter for screening, unspecified: Secondary | ICD-10-CM

## 2018-04-05 DIAGNOSIS — E2839 Other primary ovarian failure: Secondary | ICD-10-CM

## 2018-04-05 NOTE — Telephone Encounter (Signed)
Patient called requesting to speak with the nurse to report she had her bone density test today and her height measured at 5 feet 4 and 3/4 inches. She said she thinks she's lost 3/4 inches since her last check.

## 2018-04-05 NOTE — Telephone Encounter (Signed)
It showed normal spine findings but osteoporosis in her hip.  This needs treatment and evaluation for secondary causes due to significant change since prior BMD.  Please schedule appt for pt.

## 2018-04-05 NOTE — Telephone Encounter (Signed)
Spoke with patient, advised as seen below per Dr. Hyacinth MeekerMiller. OV scheduled for 04/12/18 at 3pm. Patient verbalizes understanding and is agreeable. Encounter closed.

## 2018-04-05 NOTE — Telephone Encounter (Signed)
Spoke with patient. BMD completed tpday at [facility]. Patient concerned, last OV ht was measured at 5'5" on 02/19/18, measured 5' 3/4" this morning.   Patient requesting BMD results. Advised Dr. Hyacinth MeekerMiller will review once BMD report completed, our office will return call with results and recommendations, patient agreeable.   Dr. Hyacinth MeekerMiller -please review BMD results dated 7/15 and advise.

## 2018-04-05 NOTE — Telephone Encounter (Signed)
Left message to call Tajanay Hurley at [phone].  

## 2018-04-12 ENCOUNTER — Other Ambulatory Visit: Payer: Self-pay

## 2018-04-12 ENCOUNTER — Ambulatory Visit: Payer: BC Managed Care – PPO | Admitting: Obstetrics & Gynecology

## 2018-04-12 ENCOUNTER — Encounter: Payer: Self-pay | Admitting: Obstetrics & Gynecology

## 2018-04-12 VITALS — BP 128/70 | HR 88 | Resp 18 | Ht 64.75 in | Wt 219.6 lb

## 2018-04-12 DIAGNOSIS — M810 Age-related osteoporosis without current pathological fracture: Secondary | ICD-10-CM

## 2018-04-12 DIAGNOSIS — M19049 Primary osteoarthritis, unspecified hand: Secondary | ICD-10-CM

## 2018-04-12 NOTE — Progress Notes (Deleted)
GYNECOLOGY  VISIT  CC:   ***  HPI: 62 y.o. [id number] Single Caucasian female here for BMD consult.    GYNECOLOGIC HISTORY: Patient's last menstrual period was 09/23/2007. Contraception: hysterectomy  Menopausal hormone therapy: none  Patient Active Problem List   Diagnosis Date Noted  . Lumbar stenosis with neurogenic claudication 09/11/2015  . Renal stone 10/13/2014    Past Medical History:  Diagnosis Date  . Arthritis   . Complication of anesthesia    once when teeth were numb made heart race   . Family history of adverse reaction to anesthesia    father- slow to wake up   . GERD (gastroesophageal reflux disease)   . Hiatal hernia   . IBS (irritable bowel syndrome)    age 62  . Kidney stone 3/14   no surgery  . Lumbar disc disease   . Panic disorder   . Pinched nerve    some DDD    Past Surgical History:  Procedure Laterality Date  . BACK SURGERY  09/13/2015   Back Fusion- [facility]   . CHOLECYSTECTOMY  age 62  . CYSTOSCOPY W/ URETERAL STENT PLACEMENT Left 10/13/2014   Procedure: CYSTOSCOPY WITH RETROGRADE PYELOGRAM/URETERAL STENT PLACEMENT;  Surgeon: Sebastian Acheheodore Manny, MD;  Location[facility];  Service: Urology;  Laterality: Left;  . HOLMIUM LASER APPLICATION Left 10/13/2014   Procedure: HOLMIUM LASER APPLICATION;  Surgeon: Sebastian Acheheodore Manny, MD;  Location: [facility];  Service: Urology;  Laterality: Left;  . KNEE ARTHROSCOPY     right  . NEPHROLITHOTOMY Left 10/13/2014   Procedure: NEPHROLITHOTOMY PERCUTANEOUS WITH SURGEON ACCESS;  Surgeon: Sebastian Acheheodore Manny, MD;  Location: [facility];  Service: Urology;  Laterality: Left;  . TOTAL ABDOMINAL HYSTERECTOMY  09/2006    MEDS:   Current Outpatient Medications on File Prior to Visit  Medication Sig Dispense Refill  . ALPRAZolam (XANAX) 0.5 MG tablet TAKE 1 TABLET EVERY DAY AS NEEDED FOR PANIC ATTACKS  0  . cholecalciferol (VITAMIN D) 1000 UNITS tablet Take 2,000 Units by mouth daily.     . diclofenac sodium (VOLTAREN) 1 % GEL  daily as needed.  3  . IBUPROFEN PO Take by mouth daily.    . magnesium oxide (MAG-OX) 400 MG tablet Take 400 mg by mouth 2 (two) times daily.    . Multiple Vitamin (MULTIVITAMIN WITH MINERALS) TABS tablet Take 1 tablet by mouth daily.    . Omega-3 Fatty Acids (FISH OIL) 1000 MG CAPS Take 1 capsule by mouth daily.     No current facility-administered medications on file prior to visit.     ALLERGIES: Codeine and Penicillins  Family History  Problem Relation Age of Onset  . Parkinson's disease Father        deceased  . Cancer Father        renal tumor  . Heart attack Father   . Alzheimer's disease Father   . Ulcerative colitis Sister     SH:  ***  Review of Systems  Musculoskeletal: Positive for myalgias.  All other systems reviewed and are negative.   PHYSICAL EXAMINATION:    BP 128/70 (BP Location: Right Arm, Patient Position: Sitting, Cuff Size: Large)   Pulse 88   Resp 18   Ht 5' 4.75" (1.645 m)   Wt 219 lb 9.6 oz (99.6 kg)   LMP 09/23/2007   BMI 36.83 kg/m     General appearance: alert, cooperative and appears stated age Neck: no adenopathy, supple, symmetrical, trachea midline and thyroid {CHL AMB PHY EX  THYROID NORM [phone]::"normal to inspection and palpation"} CV:  {Exam; heart brief:31539} Lungs:  {pe lungs ob:314451::"clear to auscultation, no wheezes, rales or rhonchi, symmetric air entry"} Breasts: {Exam; breast:13139::"normal appearance, no masses or tenderness"} Abdomen: soft, non-tender; bowel sounds normal; no masses,  no organomegaly  Pelvic: External genitalia:  no lesions              Urethra:  normal appearing urethra with no masses, tenderness or lesions              Bartholins and Skenes: normal                 Vagina: normal appearing vagina with normal color and discharge, no lesions              Cervix: {CHL AMB PHY EX CERVIX NORM DEFAULT:[phone]::"no lesions"}              Bimanual Exam:  Uterus:  {CHL AMB PHY EX UTERUS NORM  DEFAULT:[phone]::"normal size, contour, position, consistency, mobility, non-tender"}              Adnexa: {CHL AMB PHY EX ADNEXA NO MASS DEFAULT:[phone]::"no mass, fullness, tenderness"}              Rectovaginal: {yes no:314532}.  Confirms.              Anus:  normal sphincter tone, no lesions  Chaperone was present for exam.  Assessment: ***  Plan: ***   ~{NUMBERS; -10-45 JOINT ROM:10287} minutes spent with patient >50% of time was in face to face discussion of above.

## 2018-04-13 LAB — RHEUMATOID FACTOR: Rhuematoid fact SerPl-aCnc: <10 IU/mL (ref 0.0–13.9)

## 2018-04-13 LAB — PHOSPHORUS: Phosphorus: 4 mg/dL (ref 2.5–4.5)

## 2018-04-13 LAB — COMPREHENSIVE METABOLIC PANEL
ALT: 16 IU/L (ref 0–32)
AST: 17 IU/L (ref 0–40)
Albumin/Globulin Ratio: 1.8 (ref 1.2–2.2)
Albumin: 4.4 g/dL (ref 3.6–4.8)
Alkaline Phosphatase: 100 IU/L (ref 39–117)
BUN/Creatinine Ratio: 30 — ABNORMAL HIGH (ref 12–28)
BUN: 21 mg/dL (ref 8–27)
Bilirubin Total: <0.2 mg/dL (ref 0.0–1.2)
CO2: 28 mmol/L (ref 20–29)
CREATININE: 0.7 mg/dL (ref 0.57–1.00)
Calcium: 9.6 mg/dL (ref 8.7–10.3)
Chloride: 103 mmol/L (ref 96–106)
GFR calc Af Amer: 106 mL/min/{1.73_m2} (ref >59)
GFR calc non Af Amer: 92 mL/min/{1.73_m2} (ref >59)
GLOBULIN, TOTAL: 2.4 g/dL (ref 1.5–4.5)
Glucose: 87 mg/dL (ref 65–99)
Potassium: 4 mmol/L (ref 3.5–5.2)
Sodium: 143 mmol/L (ref 134–144)
Total Protein: 6.8 g/dL (ref 6.0–8.5)

## 2018-04-13 LAB — TSH: TSH: 1.93 u[IU]/mL (ref 0.450–4.500)

## 2018-04-13 LAB — VITAMIN D 25 HYDROXY (VIT D DEFICIENCY, FRACTURES): Vit D, 25-Hydroxy: 28.2 ng/mL — ABNORMAL LOW (ref 30.0–100.0)

## 2018-04-13 LAB — PARATHYROID HORMONE, INTACT (NO CA): PTH: 46 pg/mL (ref 15–65)

## 2018-04-15 NOTE — Progress Notes (Signed)
Subjective:    62 yrs Single Caucasian [id number] female here to discuss recent BMD obtained 04/05/78 showing osteoporosis in her hip with T score -4.1.  Her spine was not imaged due to degenerative changes.  Forearm measurment showed T score of -1.2.  This is a significant change from prior BMD obtained 10/04/09 where measurements were normal.    She has chronic back issues but d/w this is not the cause of the osteoporosis.     Osteoporosis Risk Factors  Nonmodifiable Personal Hx of fracture as an adult: no Hx of fracture in first-degree relative: no Caucasian race: yes Advanced age: no Dementia: no Poor health/frailty: no  Potentially modifiable: Tobacco use: no Low body weight (<127 lbs): no Estrogen deficiency  early menopause (age <45) or bilateral ovariectomy: no  prolonged premenopausal amenorrhea (>1 yr): no Low calcium intake (lifelong): no Alcohol use more than 2 drinks per day: no Recurrent falls: no Inadequate physical activity: yes  Current calcium and Vit D intake:  Review of Systems Constitutional: negative Behavioral/Psych: negative     Objective:   PHYSICAL EXAM BP 128/70 (BP Location: Right Arm, Patient Position: Sitting, Cuff Size: Large)   Pulse 88   Resp 18   Ht 5' 4.75" (1.645 m)   Wt 219 lb 9.6 oz (99.6 kg)   LMP 09/23/2007   BMI 36.83 kg/m  General appearance: alert, appears stated age and no distress  Imaging Bone Density: Spine T Score: not obtained due to degeneration   Hip T score:  -4.1   Forearm:  -1.2      Discussed with pt findings and significant change from prior BMD.  Evaluation for secondary causes recommended.  Pt reports having arthritis changes in hands as well.  Index finger on right hand is now angled inward.  Medications discussed as well.                                Assessment:   Osteoporosis with T score -4.1   Plan:   1.  Patient counseled in adequate calcium and vitamin D exposure.  She does not take calcium  due to kidney stones. 2.  Exercise recommended at least 30 minutes 3 times per week.  3.  Recommendation to avoid heavy ETOH use.  4.  Pharmacologic therapy therapy below discussed including risks and benefits:   Bisphosphonates po (Fosamax, Actonel, Boniva)  Bisphosphonate IV (Reclast)  Evista  Prolia subcutaneous   Forteo subcutaneous 5.  Secondary causes of osteoporosis will be assessed.  Blood work will be obtained today. 7.  After deciding about and initiating treatment, will plan to repeat bone density in 2 years.    ~25 minutes spent with patient >50% of time was in face to face discussion of above.

## 2018-04-19 ENCOUNTER — Telehealth: Payer: Self-pay | Admitting: *Deleted

## 2018-04-19 MED ORDER — DENOSUMAB 60 MG/ML ~~LOC~~ SOSY: 60.0000 mg | PREFILLED_SYRINGE | SUBCUTANEOUS | 1 refills | Status: DC

## 2018-04-19 NOTE — Telephone Encounter (Signed)
Notes recorded by Leda MinHamm, Lakashia Collison N, RN on 04/19/2018 at 1:01 PM EDT Left message to call Noreene LarssonJill at [phone].

## 2018-04-19 NOTE — Telephone Encounter (Signed)
Spoke with patient, advised as seen below per Dr. Hyacinth MeekerMiller. Patient unsure of her daily dosage of Vitamin D, "thinks 50 IU daily". Patient will confirm her dosage,  advised again of recommendations per Dr. Hyacinth MeekerMiller.   Advised patient I have placed order for Rx Prol[facility] to [facility]. Our office will review benefit of Prolia and return call with update. Explained process of benefits, requesting medication and scheduling. Patient verbalizes understanding and is agreeable.   Routing to [place] for [employer] precert.

## 2018-04-19 NOTE — Telephone Encounter (Signed)
Rx placed at [facility] for Prolia 60mg /ml. Inject into skin once q6 months in upper arm, thigh or abdomen. #1/1RF. Message to [place] to review benefits.

## 2018-04-19 NOTE — Telephone Encounter (Signed)
-----   Message from Jerene BearsMary S Miller, MD sent at 04/15/2018  8:38 PM EDT ----- Routing to triage: Please let pt know her lab work was all normal:  Thyroid, rheumatoir factor, phosphorus, parathyroid hormone, metabolic panel.  Her Vit D was a little low.  Please confirm she is taking 2000IU Vit D daily.  If not, she needs to.  If so, add an additional 1000Iu Vit D daily for a total of 3000IU.    Please initiate paperwork needed for prolia as this was the medication we decided upon when she came for OV.

## 2018-05-03 NOTE — Telephone Encounter (Signed)
Spoke with patient. Patient states she was advised by [facility] required for Prolia, requesting follow up. Advised I will follow up on PA request and return call on 8/13 to provide update. Patient agreeable.

## 2018-05-03 NOTE — Telephone Encounter (Signed)
Patient is asking to talk with Noreene LarssonJill. No further details given.

## 2018-05-04 NOTE — Telephone Encounter (Signed)
PA for Prolia completed and faxed to [facility].   Call returned to patient, advised as above. Will return call once office notified of response.

## 2018-05-04 NOTE — Telephone Encounter (Addendum)
PA approval received for Prolia. Approved 05/04/18 -05/04/2020.  Patient notified of approval. Advised patient she will be contacted by [facility] to review benefits and authorize [facility].     Call placed to [facility], Spoke with Ricki. Confirmed Rx for Prolia received and is on file. No additional info needed from provider. [facility] to review [facility] and authorize shipment.    Encounter closed.

## 2018-05-20 ENCOUNTER — Telehealth: Payer: Self-pay | Admitting: Obstetrics & Gynecology

## 2018-05-20 NOTE — Telephone Encounter (Signed)
Patient calling to speak with nurse about her medication. °

## 2018-05-20 NOTE — Telephone Encounter (Signed)
Spoke with patient. Patient states she has spoken with [facility] regarding cost of Prolia, her out of pocket cost for one dose is $750. Patient plans to apply for reimbursement card program through Prolia to reduce cost, will return call with update.

## 2018-05-21 NOTE — Telephone Encounter (Addendum)
Spoke with patient. Patient states has been approved for Prolia co-pay card, will proceed with prolia. Patient states she has spoken with [facility] and [facility]   Advised once [facility] our [facility] to confirm delivery, I will return call to schedule nurse visit.   Patient states she is currently on abx and steroid for sinus infection, asking if Prolia injection should be delayed until infection cleared? Advised will review with Dr. Hyacinth MeekerMiller and return call.

## 2018-05-21 NOTE — Telephone Encounter (Signed)
Reviewed with Dr. Hyacinth MeekerMiller. Wait for sinus infection to clear, then call and schedule injection.   Call returned to patient. Advised [facility] has called to confirm delivery for 9/4. Advised as seen above per Dr. Hyacinth MeekerMiller. Patient will return call to office once sinus infection has resolved.   Routing to provider for final review. Patient is agreeable to disposition. Will close encounter.

## 2018-05-21 NOTE — Telephone Encounter (Signed)
[facility] L[facility] from [facility] called to confirm Prolia delivery on Wednesday, 05/26/18.

## 2018-07-22 ENCOUNTER — Telehealth: Payer: Self-pay | Admitting: Obstetrics & Gynecology

## 2018-07-22 NOTE — Telephone Encounter (Signed)
Patient calling to schedule her 1st prolia injection.

## 2018-07-23 NOTE — Telephone Encounter (Signed)
Patient called and requested to schedule her Prolia shot on a Friday.

## 2018-07-23 NOTE — Telephone Encounter (Signed)
Left detailed message, ok per dpr,  advised to return call to office to schedule nurse visit for prolia injection.      Prolia received in office on 05/26/18. Patient was instructed to wait for sinus infection to clear before proceeding with injection. See telephone encounter dated 05/20/18.

## 2018-07-26 NOTE — Telephone Encounter (Signed)
Left message to call Noreene Larsson, RN at [facility] [phone].    Please schedule nurse visit for prolia if RN not available.

## 2018-07-27 NOTE — Telephone Encounter (Signed)
Patient returned call. Prolia injection scheduled for 07/30/18 at 2:30.

## 2018-07-29 NOTE — Progress Notes (Signed)
Patient in today for first Prolia injection. Patient's initial calcium level was obtained on 04/12/18  Result: 9.6  Last AEX: 11/24/16 Last BMD:04/05/18 T-score -4.1 Osteoporotic   Injection given in right Arm .  Patient tolerated injection well.  Routed to provider for review.

## 2018-07-30 ENCOUNTER — Ambulatory Visit (INDEPENDENT_AMBULATORY_CARE_PROVIDER_SITE_OTHER): Payer: BC Managed Care – PPO

## 2018-07-30 VITALS — BP 140/80 | HR 84 | Resp 18 | Ht 64.75 in | Wt 221.8 lb

## 2018-07-30 DIAGNOSIS — M810 Age-related osteoporosis without current pathological fracture: Secondary | ICD-10-CM

## 2018-07-30 MED ORDER — DENOSUMAB 60 MG/ML ~~LOC~~ SOSY
60.0000 mg | PREFILLED_SYRINGE | Freq: Once | SUBCUTANEOUS | Status: AC
  Administered 2018-07-30: 60 mg via SUBCUTANEOUS

## 2018-07-30 NOTE — Progress Notes (Signed)
Late entry for 1440: Called to patient by CMA Carolynn Serve, patient waiting in waiting room after initial Prolia injection.  States she feels like she is "having a panic attack."   Patient assisted ambulatory with cane to procedure room and seated. Snack and water given.  She is alert, oriented x 4 and speaking in full sentences. No SOB, denies throat irritation, denies itching of skin. She reports she did not each lunch. She was able to each snack crackers and drink water.   Vital signs obtained and as charted.  Heart rate palpated, strong pulse.  No problems at injection site.  Patient was able to eat snack and relax. After resting she states she felt much improved and felt it was anxiety from having a shot and not eating all day.    Patient reports she was ready for discharge after resting for approximately 10 minutes and was ambulatory with cane and steady gait with Carolynn Serve CMA for discharge from clinic.   Joeseph Amor, RN

## 2018-10-15 NOTE — Addendum Note (Signed)
Addended by: Joeseph Amor on: 10/15/2018 11:08 AM   Modules accepted: Level of Service

## 2018-10-20 ENCOUNTER — Encounter: Payer: Self-pay | Admitting: Emergency Medicine

## 2018-10-26 LAB — COLOGUARD

## 2018-11-03 LAB — IFOBT (OCCULT BLOOD): IFOBT: NEGATIVE

## 2018-11-23 DIAGNOSIS — M545 Low back pain: Secondary | ICD-10-CM | POA: Diagnosis not present

## 2018-11-23 DIAGNOSIS — M79605 Pain in left leg: Secondary | ICD-10-CM | POA: Diagnosis not present

## 2018-11-23 DIAGNOSIS — M6281 Muscle weakness (generalized): Secondary | ICD-10-CM | POA: Diagnosis not present

## 2018-11-23 DIAGNOSIS — M79604 Pain in right leg: Secondary | ICD-10-CM | POA: Diagnosis not present

## 2018-11-25 ENCOUNTER — Telehealth: Payer: Self-pay | Admitting: Obstetrics & Gynecology

## 2018-11-25 NOTE — Telephone Encounter (Signed)
Patient is calling regarding Prolia. Patient stated that she keeps getting notices from [facility] that it is time for her to pick her Prolia. Patient stated that she didn't think she was due until May and is calling to confirm that.

## 2018-11-25 NOTE — Telephone Encounter (Signed)
Call to patient.  Detailed message left, okay per designated party release form.  Advised next injection due after 01/26/2019.  [facility] let [facility] know to [facility] towards April.  Call back with any questions.

## 2018-12-02 DIAGNOSIS — M79604 Pain in right leg: Secondary | ICD-10-CM | POA: Diagnosis not present

## 2018-12-02 DIAGNOSIS — M545 Low back pain: Secondary | ICD-10-CM | POA: Diagnosis not present

## 2018-12-02 DIAGNOSIS — M6281 Muscle weakness (generalized): Secondary | ICD-10-CM | POA: Diagnosis not present

## 2018-12-02 DIAGNOSIS — M79605 Pain in left leg: Secondary | ICD-10-CM | POA: Diagnosis not present

## 2018-12-07 DIAGNOSIS — M79604 Pain in right leg: Secondary | ICD-10-CM | POA: Diagnosis not present

## 2018-12-07 DIAGNOSIS — M545 Low back pain: Secondary | ICD-10-CM | POA: Diagnosis not present

## 2018-12-07 DIAGNOSIS — M6281 Muscle weakness (generalized): Secondary | ICD-10-CM | POA: Diagnosis not present

## 2018-12-07 DIAGNOSIS — M79605 Pain in left leg: Secondary | ICD-10-CM | POA: Diagnosis not present

## 2018-12-09 NOTE — Telephone Encounter (Signed)
OK to close or is further follow up necessary? °

## 2018-12-13 DIAGNOSIS — M6281 Muscle weakness (generalized): Secondary | ICD-10-CM | POA: Diagnosis not present

## 2018-12-13 DIAGNOSIS — M545 Low back pain: Secondary | ICD-10-CM | POA: Diagnosis not present

## 2018-12-13 DIAGNOSIS — M79604 Pain in right leg: Secondary | ICD-10-CM | POA: Diagnosis not present

## 2018-12-13 DIAGNOSIS — M79605 Pain in left leg: Secondary | ICD-10-CM | POA: Diagnosis not present

## 2018-12-23 DIAGNOSIS — M79605 Pain in left leg: Secondary | ICD-10-CM | POA: Diagnosis not present

## 2018-12-23 DIAGNOSIS — M79604 Pain in right leg: Secondary | ICD-10-CM | POA: Diagnosis not present

## 2018-12-23 DIAGNOSIS — M545 Low back pain: Secondary | ICD-10-CM | POA: Diagnosis not present

## 2018-12-23 DIAGNOSIS — M6281 Muscle weakness (generalized): Secondary | ICD-10-CM | POA: Diagnosis not present

## 2019-01-12 DIAGNOSIS — M79605 Pain in left leg: Secondary | ICD-10-CM | POA: Diagnosis not present

## 2019-01-12 DIAGNOSIS — M6281 Muscle weakness (generalized): Secondary | ICD-10-CM | POA: Diagnosis not present

## 2019-01-12 DIAGNOSIS — M545 Low back pain: Secondary | ICD-10-CM | POA: Diagnosis not present

## 2019-01-12 DIAGNOSIS — M79604 Pain in right leg: Secondary | ICD-10-CM | POA: Diagnosis not present

## 2019-01-19 DIAGNOSIS — M6281 Muscle weakness (generalized): Secondary | ICD-10-CM | POA: Diagnosis not present

## 2019-01-19 DIAGNOSIS — M545 Low back pain: Secondary | ICD-10-CM | POA: Diagnosis not present

## 2019-01-19 DIAGNOSIS — M79604 Pain in right leg: Secondary | ICD-10-CM | POA: Diagnosis not present

## 2019-01-19 DIAGNOSIS — M79605 Pain in left leg: Secondary | ICD-10-CM | POA: Diagnosis not present

## 2019-01-21 DIAGNOSIS — M545 Low back pain: Secondary | ICD-10-CM | POA: Diagnosis not present

## 2019-01-21 DIAGNOSIS — M79605 Pain in left leg: Secondary | ICD-10-CM | POA: Diagnosis not present

## 2019-01-21 DIAGNOSIS — M6281 Muscle weakness (generalized): Secondary | ICD-10-CM | POA: Diagnosis not present

## 2019-01-21 DIAGNOSIS — M79604 Pain in right leg: Secondary | ICD-10-CM | POA: Diagnosis not present

## 2019-01-26 DIAGNOSIS — M545 Low back pain: Secondary | ICD-10-CM | POA: Diagnosis not present

## 2019-01-26 DIAGNOSIS — M79605 Pain in left leg: Secondary | ICD-10-CM | POA: Diagnosis not present

## 2019-01-26 DIAGNOSIS — M6281 Muscle weakness (generalized): Secondary | ICD-10-CM | POA: Diagnosis not present

## 2019-01-26 DIAGNOSIS — M79604 Pain in right leg: Secondary | ICD-10-CM | POA: Diagnosis not present

## 2019-01-27 ENCOUNTER — Telehealth: Payer: Self-pay | Admitting: Obstetrics & Gynecology

## 2019-01-27 NOTE — Telephone Encounter (Signed)
Spoke with patient. Patient is on Prolia, has received one injection. Insurance has changed to Medicare A & B, her copay for Prolia is now $1,000, previously $25. Patient requesting alternative medication. Advised Dr. Hyacinth Meeker will review, our office will return call with recommendations.   Dr. Hyacinth Meeker -please advise.

## 2019-01-27 NOTE — Telephone Encounter (Signed)
Patient is asking to change her Prolia medication due to change in insurance coverage.

## 2019-01-28 DIAGNOSIS — M79605 Pain in left leg: Secondary | ICD-10-CM | POA: Diagnosis not present

## 2019-01-28 DIAGNOSIS — M79604 Pain in right leg: Secondary | ICD-10-CM | POA: Diagnosis not present

## 2019-01-28 DIAGNOSIS — M6281 Muscle weakness (generalized): Secondary | ICD-10-CM | POA: Diagnosis not present

## 2019-01-28 DIAGNOSIS — M545 Low back pain: Secondary | ICD-10-CM | POA: Diagnosis not present

## 2019-01-28 NOTE — Telephone Encounter (Signed)
Spoke with patient advised as seen below per Dr. Hyacinth Meeker. Patient states she already has GERD, request WebEx visit to further discuss. WebEx scheduled for 5/14 at 2:30pm with Dr. Hyacinth Meeker. Email address updated. Patient verbalizes understanding and is agreeable.   Routing to provider for final review. Patient is agreeable to disposition. Will close encounter.

## 2019-01-28 NOTE — Telephone Encounter (Signed)
From cost standpoint and osteoporosis in her hip, she could start Fosamax 70mg  weekly, needs to be taken on empty stomach with water and nothing for one hour.  GERD is most common side effect.  If has lots of questions, plan Web Ex.  I am seeing some patients on Thursday.  Thanks.

## 2019-01-31 DIAGNOSIS — M545 Low back pain: Secondary | ICD-10-CM | POA: Diagnosis not present

## 2019-01-31 DIAGNOSIS — M6281 Muscle weakness (generalized): Secondary | ICD-10-CM | POA: Diagnosis not present

## 2019-01-31 DIAGNOSIS — M79605 Pain in left leg: Secondary | ICD-10-CM | POA: Diagnosis not present

## 2019-01-31 DIAGNOSIS — M79604 Pain in right leg: Secondary | ICD-10-CM | POA: Diagnosis not present

## 2019-02-02 DIAGNOSIS — M79604 Pain in right leg: Secondary | ICD-10-CM | POA: Diagnosis not present

## 2019-02-02 DIAGNOSIS — M545 Low back pain: Secondary | ICD-10-CM | POA: Diagnosis not present

## 2019-02-02 DIAGNOSIS — M79605 Pain in left leg: Secondary | ICD-10-CM | POA: Diagnosis not present

## 2019-02-02 DIAGNOSIS — M6281 Muscle weakness (generalized): Secondary | ICD-10-CM | POA: Diagnosis not present

## 2019-02-03 ENCOUNTER — Encounter: Payer: Self-pay | Admitting: Obstetrics & Gynecology

## 2019-02-03 ENCOUNTER — Ambulatory Visit (INDEPENDENT_AMBULATORY_CARE_PROVIDER_SITE_OTHER): Payer: Medicare Other | Admitting: Obstetrics & Gynecology

## 2019-02-03 DIAGNOSIS — M810 Age-related osteoporosis without current pathological fracture: Secondary | ICD-10-CM

## 2019-02-03 NOTE — Progress Notes (Signed)
Virtual Visit via Video Note  I connected with Cynthia Li on 02/03/19 at  2:30 PM EDT by a video enabled telemedicine application and verified that I am speaking with the correct person using two identifiers.  Location: Patient: home Provider: office   I discussed the limitations of evaluation and management by telemedicine and the availability of in person appointments. The patient expressed understanding and agreed to proceed.  History of Present Illness: 63 yo G2P1 SWF with h/o osteoporosis who has been treated x 1 with Prolia.  Due to insurance change, this has become much more expensive.  She would like to consider other options.  She does have GERD and is on daily pantoprazole 40mg  daily.   Options for treatment reviewed.  Given reflux hx, Reclast and Evista would be most likely to not increase these symptoms and to not cause increased/worsening reflux/gastritis issues.  She does want to know cost prior to starting.     Observations/Objective: Normal WF  Assessment and Plan: Osteoporosis Needs to stop Prolia due to cost  Follow Up Instructions: Will check about Reclast cost and then proceed with treatment if possible.  Risks, side effects reviewed.  Questions answered.  Pt has blood work about three months ago and will get this to me from PCPs office (Dr. Sharon Mt).  She will call for this.   I provided 17 minutes of non-face-to-face time during this encounter.   Jerene Bears, MD

## 2019-02-09 DIAGNOSIS — M79605 Pain in left leg: Secondary | ICD-10-CM | POA: Diagnosis not present

## 2019-02-09 DIAGNOSIS — M545 Low back pain: Secondary | ICD-10-CM | POA: Diagnosis not present

## 2019-02-09 DIAGNOSIS — M79604 Pain in right leg: Secondary | ICD-10-CM | POA: Diagnosis not present

## 2019-02-09 DIAGNOSIS — M6281 Muscle weakness (generalized): Secondary | ICD-10-CM | POA: Diagnosis not present

## 2019-02-16 DIAGNOSIS — M79605 Pain in left leg: Secondary | ICD-10-CM | POA: Diagnosis not present

## 2019-02-16 DIAGNOSIS — M6281 Muscle weakness (generalized): Secondary | ICD-10-CM | POA: Diagnosis not present

## 2019-02-16 DIAGNOSIS — M545 Low back pain: Secondary | ICD-10-CM | POA: Diagnosis not present

## 2019-02-16 DIAGNOSIS — M79604 Pain in right leg: Secondary | ICD-10-CM | POA: Diagnosis not present

## 2019-02-18 ENCOUNTER — Telehealth: Payer: Self-pay | Admitting: Obstetrics & Gynecology

## 2019-02-18 DIAGNOSIS — M545 Low back pain: Secondary | ICD-10-CM | POA: Diagnosis not present

## 2019-02-18 DIAGNOSIS — M79605 Pain in left leg: Secondary | ICD-10-CM | POA: Diagnosis not present

## 2019-02-18 DIAGNOSIS — M810 Age-related osteoporosis without current pathological fracture: Secondary | ICD-10-CM

## 2019-02-18 DIAGNOSIS — M79604 Pain in right leg: Secondary | ICD-10-CM | POA: Diagnosis not present

## 2019-02-18 DIAGNOSIS — M6281 Muscle weakness (generalized): Secondary | ICD-10-CM | POA: Diagnosis not present

## 2019-02-18 NOTE — Telephone Encounter (Signed)
Spoke with patient. Patient has decided to proceed with Prolia vs reclast discussed at last OV. States she has a new insurance plan that will be effective 02/21/19. Was due for prolia May 2020.  Reviewed with patient referral to endocrinology for management of ongoing medication for osteoporosis, patient is agreeable. Reviewed options for endocrinology, patient request referral to Dr. Elvera Lennox.   Patient asking if Dr. Hyacinth Meeker received labs from [facility]. [facility]? Advised I will f/u and advise.   Patient verbalizes understanding and is agreeable.  Order pended for referral to [facility].   Dr. Hyacinth Meeker -please review and advise.

## 2019-02-18 NOTE — Telephone Encounter (Signed)
Patient would like to discuss starting prolia.

## 2019-03-04 ENCOUNTER — Other Ambulatory Visit: Payer: Self-pay | Admitting: Obstetrics & Gynecology

## 2019-03-04 DIAGNOSIS — Z1231 Encounter for screening mammogram for malignant neoplasm of breast: Secondary | ICD-10-CM

## 2019-03-07 NOTE — Telephone Encounter (Signed)
Patient checking status of previous message. She also has a yeast infection that she would like something called in for. She is aware we don't treat over the phone for this and may need an appointment. Ok to leave a detailed message if not available.

## 2019-03-07 NOTE — Telephone Encounter (Signed)
Call placed to [facility]/Dr. Jackelyn Poling, spoke with Rodena Piety. Patient previously requested copy of labs from 09/2018 be faxed to Dr. Sabra Heck, not received to date.  Rodena Piety will fax labs to [phone].    Results received and to Dr. Sabra Heck to review.

## 2019-03-17 NOTE — Telephone Encounter (Signed)
Reviewed with Dr. Sabra Heck previously, routing to Dr. Sabra Heck to return call.

## 2019-03-17 NOTE — Telephone Encounter (Signed)
Patient is calling to follow up on starting Prolia. Patient is also requesting "cream or ointment for a yeast infection."

## 2019-03-21 ENCOUNTER — Telehealth: Payer: Self-pay | Admitting: Obstetrics & Gynecology

## 2019-03-21 MED ORDER — FLUCONAZOLE 150 MG PO TABS: ORAL_TABLET | ORAL | 0 refills | Status: DC

## 2019-03-21 NOTE — Telephone Encounter (Signed)
Spoke with patient.   1. Patient calling to f/u with prolia and to see if labs were received from PCP? Advised patient labs were received from PCP and forwarded to Dr. Sabra Heck to review. Advised I have spoken with Dr. Sabra Heck and she advised she will contact you directly to discuss.   2. Patient reports itching under breast and where "legs meet her body". Denies any other symptoms. Has a RX for nystatin cream sent in 11/2016 now expired, requesting refill of appropriate.   3. Requesting return call from billing office regarding DOS 02/03/19 , message forwarded to business office.   Dr. Sabra Heck -please advise

## 2019-03-21 NOTE — Telephone Encounter (Signed)
See telephone encounter dated 02/18/19.   Encounter closed.

## 2019-03-21 NOTE — Telephone Encounter (Signed)
Patient is returning call regarding Prolia. See prior telephone note.

## 2019-03-21 NOTE — Telephone Encounter (Signed)
Wilson Singer N routed this conversation to [place]   03/21/19 9:17 AM Note   Patient is returning call regarding Prolia. See prior telephone note.

## 2019-03-21 NOTE — Telephone Encounter (Signed)
Call reviewed with Dr. Sabra Heck. Call returned to patient.   1. Dr. Sabra Heck has received and reviewed labs from PCP.  Advised referral recommended to endocrinology for management of osteoporosis. Reviewed provider option, patient request Dr. Cruzita Lederer, order placed. Advised their office will contact you directly with appt details once scheduled. Patient agreeable.   2. Rx for diflucan to verified pharmacy. Advised if symptoms do not resolve will need OV for further evaluation. Patient agreeable.   3. Call transferred to front office staff to confirm insurance information on file.   Routing to provider for final review. Patient is agreeable to disposition. Will close encounter.  Cc: Magdalene Patricia

## 2019-03-24 ENCOUNTER — Other Ambulatory Visit: Payer: Self-pay | Admitting: Obstetrics & Gynecology

## 2019-03-24 ENCOUNTER — Telehealth: Payer: Self-pay | Admitting: Obstetrics & Gynecology

## 2019-03-24 MED ORDER — NYSTATIN-TRIAMCINOLONE 100000-0.1 UNIT/GM-% EX OINT: TOPICAL_OINTMENT | CUTANEOUS | 0 refills | Status: DC

## 2019-03-24 MED ORDER — NYSTATIN 100000 UNIT/GM EX CREA: TOPICAL_CREAM | CUTANEOUS | 0 refills | Status: DC

## 2019-03-24 NOTE — Telephone Encounter (Signed)
Spoke with patient. She picked up Diflucan Rx at pharmacy. Patient thought she was getting Nystatin cream. Does Dr.Miller want her to take Diflucan instead of Nystatin? If not, can we call in Nystatin to use prn? See note of 02-18-19.  Routing to provider.

## 2019-03-24 NOTE — Telephone Encounter (Signed)
Patient notified Nystatin cream sent to pharmacy on file.

## 2019-03-24 NOTE — Telephone Encounter (Signed)
I'm sorry that I misunderstood her request.  It is fine to take the diflucan as prescribed but also ok to send in rx for nystatin cream bid for up to 7 days to pharmacy for pt.  Thanks.

## 2019-03-24 NOTE — Telephone Encounter (Signed)
Patient is calling regarding medication that was prescribed for a yeast infection. Patient stated that the medication stated that it was for vaginal yeast. Patient stated that she is currently having a breakout of yeast under her breasts, not vaginal yeast.

## 2019-03-24 NOTE — Addendum Note (Signed)
Addended by: Lowella Fairy on: 03/24/2019 04:45 PM   Modules accepted: Orders

## 2019-03-24 NOTE — Telephone Encounter (Signed)
You send in Mycolog instead of just nystatin.  Can you change it to nystatin and call the pharmacy and cancel the mycolog?  Thanks.

## 2019-04-07 ENCOUNTER — Ambulatory Visit: Payer: BC Managed Care – PPO

## 2019-05-09 ENCOUNTER — Ambulatory Visit: Payer: BC Managed Care – PPO | Admitting: Family Medicine

## 2019-05-11 ENCOUNTER — Other Ambulatory Visit: Payer: Self-pay

## 2019-05-13 ENCOUNTER — Ambulatory Visit: Payer: BC Managed Care – PPO | Admitting: Internal Medicine

## 2019-05-13 ENCOUNTER — Ambulatory Visit: Payer: BC Managed Care – PPO | Admitting: Family Medicine

## 2019-05-24 ENCOUNTER — Ambulatory Visit (INDEPENDENT_AMBULATORY_CARE_PROVIDER_SITE_OTHER): Payer: BC Managed Care – PPO | Admitting: Family Medicine

## 2019-05-24 ENCOUNTER — Ambulatory Visit: Payer: Self-pay

## 2019-05-24 ENCOUNTER — Other Ambulatory Visit: Payer: Self-pay | Admitting: Family Medicine

## 2019-05-24 ENCOUNTER — Encounter: Payer: Self-pay | Admitting: Family Medicine

## 2019-05-24 ENCOUNTER — Other Ambulatory Visit: Payer: Self-pay

## 2019-05-24 DIAGNOSIS — M545 Low back pain: Secondary | ICD-10-CM | POA: Diagnosis not present

## 2019-05-24 DIAGNOSIS — E559 Vitamin D deficiency, unspecified: Secondary | ICD-10-CM

## 2019-05-24 DIAGNOSIS — M25551 Pain in right hip: Secondary | ICD-10-CM | POA: Diagnosis not present

## 2019-05-24 DIAGNOSIS — G8929 Other chronic pain: Secondary | ICD-10-CM

## 2019-05-24 DIAGNOSIS — M25552 Pain in left hip: Secondary | ICD-10-CM

## 2019-05-24 DIAGNOSIS — M810 Age-related osteoporosis without current pathological fracture: Secondary | ICD-10-CM | POA: Diagnosis not present

## 2019-05-24 MED ORDER — VITAMIN K2 100 MCG PO TABS: 100.0000 ug | ORAL_TABLET | Freq: Every day | ORAL | 3 refills | Status: DC

## 2019-05-24 MED ORDER — FAMOTIDINE 20 MG PO TABS: 20.0000 mg | ORAL_TABLET | Freq: Two times a day (BID) | ORAL | 6 refills | Status: DC | PRN

## 2019-05-24 MED ORDER — VITAMIN D-3 125 MCG (5000 UT) PO TABS: 1.0000 | ORAL_TABLET | Freq: Every day | ORAL | 3 refills | Status: DC

## 2019-05-24 NOTE — Progress Notes (Signed)
Chronic low back and bilateral hip pain.  Dr. Einar Li was concerned that her hips might be arthritic, and indeed x-rays show severe end-stage bilateral DJD.  She also has osteoporosis in the hips.  Will refer her to Dr. Magnus Li for bilateral hip replacements.  Will optimize vitamin D therapy, start vitamin K2.  Will wean from proton pump inhibitor since it likely contributes to osteoporosis.  Previous steroid use also likely contributed.      Office Visit Note   Patient: Cynthia Li           Date of Birth: Apr 25, 1956           MRN: [id number] Visit Date: 05/24/2019 Requested by: Cynthia Li, Scott, Li [address] [place],  [place] [id number] PCP: Cynthia Li, Scott, Li  Subjective: Chief Complaint  Patient presents with  . Lower Back - Pain    [facility] and low back pain. Walking with a cane since last 10/19.  Marland Kitchen. Left Hip - Pain  . Right Hip - Pain    HPI: He is here at the request of Dr. Beryle FlockBrad Li for bilateral hip pain.  She is status post lumbar fusion a couple years ago and did well after that.  But she started having increasing low back and bilateral hip pain about a year ago, to the point that she had to start using a cane for ambulation.  She has difficulty standing up straight.  She has pain all the time, it hurts to transition in and out of a car.  She has been working with her physical therapist and was not progressing adequately.  His assessment suggested possible arthritis in her hips.  She has osteoporosis in her hips and is currently on Prolia injections every 6 months.  She has vitamin D deficiency and is taking 1000 IU daily.  She has been on multiple rounds of oral steroids in the past for her back problems.  She is also on proton pump inhibitor chronically for hiatal hernia.               ROS: Denies fevers or chills.  Denies history of diabetes.  She is not a smoker.  She is a former Engineer, siteschool teacher.  All other systems were  reviewed and are negative.  Objective: Vital Signs: LMP 09/23/2007   Physical Exam:  General:  Alert and oriented, in no acute distress. Pulm:  Breathing unlabored. Psy:  Normal mood, congruent affect. Skin: Well-healed midline lumbar surgical scar. Hips: Her low back is not significantly tender to palpation today.  She has extremely limited bilateral hip range of motion, cannot internally rotate past neutral.  External rotation motion is not good either.  Lower extremity strength and reflexes are normal.  Imaging: X-rays both hips: She has end-stage bone-on-bone bilateral hip DJD with some collapse of the femoral heads.   Assessment & Plan: 1.  Bilateral hip pain due to end-stage DJD -I will refer her to Dr. Magnus Li for bilateral hip replacements. -We will optimize her bone regimen and help taper her off proton pump inhibitor.      Procedures: No procedures performed  No notes on file     PMFS History: Patient Active Problem List   Diagnosis Date Noted  . Lumbar stenosis with neurogenic claudication 09/11/2015  . Renal stone 10/13/2014   Past Medical History:  Diagnosis Date  . Arthritis   . Complication of anesthesia    once when teeth were numb made heart  race   . Family history of adverse reaction to anesthesia    father- slow to wake up   . GERD (gastroesophageal reflux disease)   . Hiatal hernia   . IBS (irritable bowel syndrome)    age 62  . Kidney stone 3/14   no surgery  . Lumbar disc disease   . Panic disorder   . Pinched nerve    some DDD    Family History  Problem Relation Age of Onset  . Parkinson's disease Father        deceased  . Cancer Father        renal tumor  . Heart attack Father   . Alzheimer's disease Father   . Ulcerative colitis Sister     Past Surgical History:  Procedure Laterality Date  . BACK SURGERY  09/13/2015   Back Fusion- [facility]   . CHOLECYSTECTOMY  age 69  . CYSTOSCOPY W/ URETERAL STENT PLACEMENT  Left 10/13/2014   Procedure: CYSTOSCOPY WITH RETROGRADE PYELOGRAM/URETERAL STENT PLACEMENT;  Surgeon: Cynthia Frock, Li;  Location: [facility];  Service: Urology;  Laterality: Left;  . HOLMIUM LASER APPLICATION Left 8/41/6606   Procedure: HOLMIUM LASER APPLICATION;  Surgeon: Cynthia Frock, Li;  Location: [facility];  Service: Urology;  Laterality: Left;  . KNEE ARTHROSCOPY     right  . NEPHROLITHOTOMY Left 10/13/2014   Procedure: NEPHROLITHOTOMY PERCUTANEOUS WITH SURGEON ACCESS;  Surgeon: Cynthia Frock, Li;  Location: [facility];  Service: Urology;  Laterality: Left;  . TOTAL ABDOMINAL HYSTERECTOMY  09/2006   Social History   Occupational History  . Not on file  Tobacco Use  . Smoking status: Never Smoker  . Smokeless tobacco: Never Used  Substance and Sexual Activity  . Alcohol use: Yes    Comment: occ  . Drug use: No  . Sexual activity: Not Currently    Partners: Male    Birth control/protection: Surgical    Comment: TAH

## 2019-05-25 ENCOUNTER — Ambulatory Visit
Admission: RE | Admit: 2019-05-25 | Discharge: 2019-05-25 | Disposition: A | Discharge status: Home / Self Care | Payer: BC Managed Care – PPO | Source: Ambulatory Visit | Attending: Obstetrics & Gynecology | Admitting: Obstetrics & Gynecology

## 2019-05-25 DIAGNOSIS — Z1231 Encounter for screening mammogram for malignant neoplasm of breast: Secondary | ICD-10-CM

## 2019-05-26 ENCOUNTER — Ambulatory Visit (INDEPENDENT_AMBULATORY_CARE_PROVIDER_SITE_OTHER): Payer: Medicare Other | Admitting: Physician Assistant

## 2019-05-26 ENCOUNTER — Encounter: Payer: Self-pay | Admitting: Physician Assistant

## 2019-05-26 DIAGNOSIS — M1612 Unilateral primary osteoarthritis, left hip: Secondary | ICD-10-CM

## 2019-05-26 DIAGNOSIS — M1611 Unilateral primary osteoarthritis, right hip: Secondary | ICD-10-CM | POA: Diagnosis not present

## 2019-05-26 NOTE — Progress Notes (Signed)
Office Visit Note   Patient: Cynthia Li           Date of Birth: Jan 27, 1956           MRN: [id number] Visit Date: 05/26/2019              Requested by: Alysia PennaHolwerda, Scott, MD [address] [place],  [place] [id number] PCP: Alysia PennaHolwerda, Scott, MD   Assessment & Plan: Visit Diagnoses:  1. Primary osteoarthritis of left hip   2. Unilateral primary osteoarthritis, right hip     Plan: Due to the severe degenerative changes of both hips recommend total hip replacements.  She like to proceed with the left hip first.  Risk benefits of surgery discussed with patient.  These include but are not limited to nerve or vessel injury, blood loss, DVT/PE, infection, worsening pain and prolonged pain.  Questions encouraged and answered by Dr. Magnus IvanBlackman and myself.  Handout was given on anterior hip replacement.  Postoperative protocol was discussed with patient at length.  Follow-Up Instructions: Return 2 weeks postop.   Orders:  No orders of the defined types were placed in this encounter.  No orders of the defined types were placed in this encounter.     Procedures: No procedures performed   Clinical Data: No additional findings.   Subjective: Chief Complaint  Patient presents with  . Right Hip - Pain  . Left Hip - Pain    HPI Patient is a 63 year old female who is seen at request of Dr. Prince Romehilts for bilateral hip pain.  She states she has had hip pain for at least 3 years.  She is been actually using a cane over the last year.  She is tried ibuprofen physical therapy heating pad has severe pain in both hips.  She feels that the left hip is weaker than the right.  She states she always felt that her pain in both hips was due to her back but after going to physical therapy is felt that she may have a hip problem.  She saw Dr. Prince Romehilts and had radiographs of her pelvis and bilateral hips which showed end-stage degenerative joint disease of both hips with high riding hips bilaterally.  She is here  today to discuss treatment for her degenerative joint disease of both hips.  She states that she has difficulty doing any activities for exercise or walking for pleasure.  She has also difficulty putting on her shoes and socks.  Review of Systems Denies any fevers chills shortness of breath chest pain.  Objective: Vital Signs: LMP 09/23/2007   Physical Exam Constitutional:      Appearance: She is not ill-appearing or diaphoretic.  Pulmonary:     Effort: Pulmonary effort is normal.  Neurological:     Mental Status: She is alert and oriented to person, place, and time.  Psychiatric:        Mood and Affect: Mood normal.        Behavior: Behavior normal.     Ortho Exam Bilateral hips she has minimal external rotation and no internal rotation bilaterally.  Calves are supple nontender bilaterally dorsiflexion plantarflexion bilateral ankles intact.  Antalgic gait with the use of a cane in her left hand.  Specialty Comments:  No specialty comments available.  Imaging: No results found.   PMFS History: Patient Active Problem List   Diagnosis Date Noted  . Lumbar stenosis with neurogenic claudication 09/11/2015  . Renal stone 10/13/2014   Past Medical History:  Diagnosis Date  .  Arthritis   . Complication of anesthesia    once when teeth were numb made heart race   . Family history of adverse reaction to anesthesia    father- slow to wake up   . GERD (gastroesophageal reflux disease)   . Hiatal hernia   . IBS (irritable bowel syndrome)    age 16  . Kidney stone 3/14   no surgery  . Lumbar disc disease   . Panic disorder   . Pinched nerve    some DDD    Family History  Problem Relation Age of Onset  . Parkinson's disease Father        deceased  . Cancer Father        renal tumor  . Heart attack Father   . Alzheimer's disease Father   . Ulcerative colitis Sister   . Breast cancer Neg Hx     Past Surgical History:  Procedure Laterality Date  . BACK SURGERY   09/13/2015   Back Fusion- [facility]   . CHOLECYSTECTOMY  age 64  . CYSTOSCOPY W/ URETERAL STENT PLACEMENT Left 10/13/2014   Procedure: CYSTOSCOPY WITH RETROGRADE PYELOGRAM/URETERAL STENT PLACEMENT;  Surgeon: Alexis Frock, MD;  Location: [facility];  Service: Urology;  Laterality: Left;  . HOLMIUM LASER APPLICATION Left 5/99/3570   Procedure: HOLMIUM LASER APPLICATION;  Surgeon: Alexis Frock, MD;  Location: [facility];  Service: Urology;  Laterality: Left;  . KNEE ARTHROSCOPY     right  . NEPHROLITHOTOMY Left 10/13/2014   Procedure: NEPHROLITHOTOMY PERCUTANEOUS WITH SURGEON ACCESS;  Surgeon: Alexis Frock, MD;  Location: [facility];  Service: Urology;  Laterality: Left;  . TOTAL ABDOMINAL HYSTERECTOMY  09/2006   Social History   Occupational History  . Not on file  Tobacco Use  . Smoking status: Never Smoker  . Smokeless tobacco: Never Used  Substance and Sexual Activity  . Alcohol use: Yes    Comment: occ  . Drug use: No  . Sexual activity: Not Currently    Partners: Male    Birth control/protection: Surgical    Comment: TAH

## 2019-05-27 ENCOUNTER — Ambulatory Visit: Payer: BC Managed Care – PPO | Admitting: Obstetrics & Gynecology

## 2019-05-27 ENCOUNTER — Other Ambulatory Visit: Payer: Self-pay

## 2019-06-01 ENCOUNTER — Other Ambulatory Visit: Payer: Self-pay

## 2019-06-01 ENCOUNTER — Encounter: Payer: Self-pay | Admitting: Internal Medicine

## 2019-06-01 ENCOUNTER — Ambulatory Visit (INDEPENDENT_AMBULATORY_CARE_PROVIDER_SITE_OTHER): Payer: Medicare Other | Admitting: Internal Medicine

## 2019-06-01 VITALS — BP 130/90 | HR 68 | Ht 64.75 in | Wt 219.0 lb

## 2019-06-01 DIAGNOSIS — M810 Age-related osteoporosis without current pathological fracture: Secondary | ICD-10-CM

## 2019-06-01 DIAGNOSIS — E559 Vitamin D deficiency, unspecified: Secondary | ICD-10-CM

## 2019-06-01 LAB — VITAMIN D 25 HYDROXY (VIT D DEFICIENCY, FRACTURES): VITD: 44.56 ng/mL (ref 30.00–100.00)

## 2019-06-01 NOTE — Patient Instructions (Signed)
Please stop at the lab.  We will submit an application for Prolia to the insurance.  Please come back for a follow-up appointment in 1 year.  How Can I Prevent Falls? Men and women with osteoporosis need to take care not to fall down. Falls can break bones. Some reasons people fall are: Poor vision  Poor balance  Certain diseases that affect how you walk  Some types of medicine, such as sleeping pills.  Some tips to help prevent falls outdoors are: Use a cane or walker  Wear rubber-soled shoes so you don't slip  Walk on grass when sidewalks are slippery  In winter, put salt or kitty litter on icy sidewalks.  Some ways to help prevent falls indoors are: Keep rooms free of clutter, especially on floors  Use plastic or carpet runners on slippery floors  Wear low-heeled shoes that provide good support  Do not walk in socks, stockings, or slippers  Be sure carpets and area rugs have skid-proof backs or are tacked to the floor  Be sure stairs are well lit and have rails on both sides  Put grab bars on bathroom walls near tub, shower, and toilet  Use a rubber bath mat in the shower or tub  Keep a flashlight next to your bed  Use a sturdy step stool with a handrail and wide steps  Add more lights in rooms (and night lights) Buy a cordless phone to keep with you so that you don't have to rush to the phone       when it rings and so that you can call for help if you fall.   (adapted from http://www.niams.NightlifePreviews.se)  Please check out the following book about best diet for bone health:   Exercise for Strong Bones (from [place]) There are two types of exercises that are important for building and maintaining bone density:  weight-bearing and muscle-strengthening exercises. Weight-bearing Exercises These exercises include activities that make you move against gravity while staying upright. Weight-bearing exercises can  be high-impact or low-impact. High-impact weight-bearing exercises help build bones and keep them strong. If you have broken a bone due to osteoporosis or are at risk of breaking a bone, you may need to avoid high-impact exercises. If you're not sure, you should check with your healthcare provider. Examples of high-impact weight-bearing exercises are: . Dancing . Doing high-impact aerobics . Hiking . Jogging/running . Jumping Rope . Stair climbing . Tennis Low-impact weight-bearing exercises can also help keep bones strong and are a safe alternative if you cannot do high-impact exercises. Examples of low-impact weight-bearing exercises are: . Using elliptical training machines . Doing low-impact aerobics . Using stair-step machines . Fast walking on a treadmill or outside Muscle-Strengthening Exercises These exercises include activities where you move your body, a weight or some other resistance against gravity. They are also known as resistance exercises and include: . Lifting weights . Using elastic exercise bands . Using weight machines . Lifting your own body weight . Functional movements, such as standing and rising up on your toes Yoga and Pilates can also improve strength, balance and flexibility. However, certain positions may not be safe for people with osteoporosis or those at increased risk of broken bones. For example, exercises that have you bend forward may increase the chance of breaking a bone in the spine. A physical therapist should be able to help you learn which exercises are safe and appropriate for you. Non-Impact Exercises Non-impact exercises can help you to improve  balance, posture and how well you move in everyday activities. These exercises can also help to increase muscle strength and decrease the risk of falls and broken bones. Some of these exercises include: . Balance exercises that strengthen your legs and test your balance, such as Tai Chi, can decrease your  risk of falls. . Posture exercises that improve your posture and reduce rounded or "sloping" shoulders can help you decrease the chance of breaking a bone, especially in the spine. . Functional exercises that improve how well you move can help you with everyday activities and decrease your chance of falling and breaking a bone. For example, if you have trouble getting up from a chair or climbing stairs, you should do these activities as exercises. A physical therapist can teach you balance, posture and functional exercises. Starting a New Exercise Program If you haven't exercised regularly for a while, check with your healthcare provider before beginning a new exercise program-particularly if you have health problems such as heart disease, diabetes or high blood pressure. If you're at high risk of breaking a bone, you should work with a physical therapist to develop a safe exercise program. Once you have your healthcare provider's approval, start slowly. If you've already broken bones in the spine because of osteoporosis, be very careful to avoid activities that require reaching down, bending forward, rapid twisting motions, heavy lifting and those that increase your chance of a fall. As you get started, your muscles may feel sore for a day or two after you exercise. If soreness lasts longer, you may be working too hard and need to ease up. Exercises should be done in a pain-free range of motion. How Much Exercise Do You Need? Weight-bearing exercises 30 minutes on most days of the week. Do a 30-minutesession or multiple sessions spread out throughout the day. The benefits to your bones are the same.   Muscle-strengthening exercises Two to three days per week. If you don't have much time for strengthening/resistance training, do small amounts at a time. You can do just one body part each day. For example do arms one day, legs the next and trunk the next. You can also spread these exercises out during your  normal day.  Balance, posture and functional exercises Every day or as often as needed. You may want to focus on one area more than the others. If you have fallen or lose your balance, spend time doing balance exercises. If you are getting rounded shoulders, work more on posture exercises. If you have trouble climbing stairs or getting up from the couch, do more functional exercises. You can also perform these exercises at one time or spread them during your day. Work with a phyiscal therapist to learn the right exercises for you.

## 2019-06-01 NOTE — Progress Notes (Signed)
Patient ID: Cynthia Li, female   DOB: 1956-03-21, 63 y.o.   MRN: [id number]    HPI  Cynthia Li is a 63 y.o.-year-old female, referred by her OB/GYN doctor, Dr. Valentina ShaggyMary Miller, for management of osteoporosis (OP).  Pt was dx with OP in 03/2018.  I reviewed pt's DXA scans: Date Spine T score FN T score 33% distal Radius  Ultra distal radius  07/15//2019  L1, L2: -0.2 RFN: -2.8 LFN: -3.0  -1.2  +0.9  10/21/2009  L1, L4: +0.9 n/a n/a n/a   She denies fractures.  No falls.  No dizziness/orthostasis/poor vision. She had vertigo >> takes Zyrtec.  Previous OP treatments:  - Prolia x 1, 09/2018. Could not afford a second dose, but now new insurance >> [facility].  She has a h/o vitamin D insufficiency. Reviewed available vit D levels: Lab Results  Component Value Date   VD25OH 28.2 (L) 04/12/2018   Pt is on: - vitamin D 5000 IU + vit K2 - started last week by orthopedics  She will have L hip replacement on 07/01/2019 (Dr. Magnus IvanBlackman). She will then have to have the R hip replaced.  + weight bearing exercises. She goes PT x2 a week and does stretches qam.  She had back fusion sx (L4, L5) in 08/2016.  She does not take high vitamin A doses.  Menopause was at  63 y/o after hysterectomy in 2007.  ? FH of osteoporosis - mother with scoliosis.  No h/o hyper/hypocalcemia or hyperparathyroidism. + h/o kidney stones - surgery 09/2015. Lab Results  Component Value Date   PTH 46 04/12/2018   CALCIUM 9.6 04/12/2018   CALCIUM 10.1 08/31/2015   CALCIUM 9.2 10/14/2014   CALCIUM 9.1 10/13/2014   CALCIUM 9.9 10/10/2014   CALCIUM 9.9 09/10/2014   No h/o thyrotoxicosis. Reviewed TSH recent levels:  Lab Results  Component Value Date   TSH 1.930 04/12/2018   No h/o CKD. Last BUN/Cr: Lab Results  Component Value Date   BUN 21 04/12/2018   CREATININE 0.70 04/12/2018   She has a history of GERD and takes PPIs.  ROS: Constitutional: no weight gain, no weight loss, no fatigue, no  subjective hyperthermia, no subjective hypothermia, no nocturia Eyes: no blurry vision, no xerophthalmia ENT: no sore throat, no nodules palpated in neck, no dysphagia, no odynophagia, no hoarseness, no tinnitus, no hypoacusis Cardiovascular: no CP, no SOB, no palpitations, no leg swelling Respiratory: no cough, no SOB, no wheezing Gastrointestinal: no N, no V, no D, no C, + acid reflux Musculoskeletal: + muscle, + joint aches Skin: no rash, no hair loss Neurological: no tremors, no numbness or tingling/no dizziness/no HAs Psychiatric: no depression, no anxiety  I reviewed pt's medications, allergies, PMH, social hx, family hx, and changes were documented in the history of present illness. Otherwise, unchanged from my initial visit note.  Past Medical History:  Diagnosis Date  . Arthritis   . Complication of anesthesia    once when teeth were numb made heart race   . Family history of adverse reaction to anesthesia    father- slow to wake up   . GERD (gastroesophageal reflux disease)   . Hiatal hernia   . IBS (irritable bowel syndrome)    age 63  . Kidney stone 3/14   no surgery  . Lumbar disc disease   . Panic disorder   . Pinched nerve    some DDD   Past Surgical History:  Procedure Laterality Date  . BACK SURGERY  09/13/2015   Back Fusion- [facility]   . CHOLECYSTECTOMY  age 89  . CYSTOSCOPY W/ URETERAL STENT PLACEMENT Left 10/13/2014   Procedure: CYSTOSCOPY WITH RETROGRADE PYELOGRAM/URETERAL STENT PLACEMENT;  Surgeon: Sebastian Acheheodore Manny, MD;  Location: [facility];  Service: Urology;  Laterality: Left;  . HOLMIUM LASER APPLICATION Left 10/13/2014   Procedure: HOLMIUM LASER APPLICATION;  Surgeon: Sebastian Acheheodore Manny, MD;  Location: [facility];  Service: Urology;  Laterality: Left;  . KNEE ARTHROSCOPY     right  . NEPHROLITHOTOMY Left 10/13/2014   Procedure: NEPHROLITHOTOMY PERCUTANEOUS WITH SURGEON ACCESS;  Surgeon: Sebastian Acheheodore Manny, MD;  Location: [facility];  Service: Urology;   Laterality: Left;  . TOTAL ABDOMINAL HYSTERECTOMY  09/2006   Social History   Socioeconomic History  . Marital status: Single    Spouse name: Not on file  . Number of children: 1  . Years of education: Not on file  . Highest education level: Not on file  Occupational History  . Not on file  Social Needs  . Financial resource strain: Not on file  . Food insecurity    Worry: Not on file    Inability: Not on file  . Transportation needs    Medical: Not on file    Non-medical: Not on file  Tobacco Use  . Smoking status: Never Smoker  . Smokeless tobacco: Never Used  Substance and Sexual Activity  . Alcohol use: Yes    Comment: occ  . Drug use: No  . Sexual activity: Not Currently    Partners: Male    Birth control/protection: Surgical    Comment: TAH   Current Outpatient Medications on File Prior to Visit  Medication Sig Dispense Refill  . ALPRAZolam (XANAX) 0.5 MG tablet TAKE 1 TABLET EVERY DAY AS NEEDED FOR PANIC ATTACKS  0  . Cholecalciferol (VITAMIN D-3) 125 MCG (5000 UT) TABS Take 1 tablet by mouth daily. 90 tablet 3  . denosumab (PROLIA) 60 MG/ML SOSY injection Inject 60 mg into the skin every 6 (six) months. In upper arm, abdomen or thigh. 1 mL 1  . diclofenac sodium (VOLTAREN) 1 % GEL daily as needed.  3  . famotidine (PEPCID) 20 MG tablet Take 1 tablet (20 mg total) by mouth 2 (two) times daily as needed for heartburn or indigestion. 60 tablet 6  . fluconazole (DIFLUCAN) 150 MG tablet Take one tablet 150 mg now PO, may repeat in 72 hours if symptoms still present. 2 tablet 0  . fluticasone (FLONASE) 50 MCG/ACT nasal spray Place 1 spray into both nostrils daily.    . IBUPROFEN PO Take by mouth daily.    Marland Kitchen. levocetirizine (XYZAL) 5 MG tablet Take 1 tablet by mouth daily.    . magnesium oxide (MAG-OX) 400 MG tablet Take 400 mg by mouth 2 (two) times daily.    . Menatetrenone (VITAMIN K2) 100 MCG TABS Take 100 mcg by mouth daily. 90 tablet 3  . Multiple Vitamin  (MULTIVITAMIN WITH MINERALS) TABS tablet Take 1 tablet by mouth daily.    Marland Kitchen. nystatin cream (MYCOSTATIN) Apply 1 application bid up to 7 days 30 g 0  . Omega-3 Fatty Acids (FISH OIL) 1000 MG CAPS Take 1 capsule by mouth daily.    . pantoprazole (PROTONIX) 40 MG tablet Take 40 mg by mouth daily.    Marland Kitchen. triamcinolone cream (KENALOG) 0.5 % APPLY TWICE A DAY AS NEEDED FOR ECZEMA     No current facility-administered medications on file prior to visit.    Allergies  Allergen Reactions  . Codeine     headaches  . Penicillins Hives       Family History  Problem Relation Age of Onset  . Parkinson's disease Father        deceased  . Cancer Father        renal tumor  . Heart attack Father   . Alzheimer's disease Father   . Ulcerative colitis Sister   . Breast cancer Neg Hx     PE: BP 130/90   Pulse 68   Ht 5' 4.75" (1.645 m)   Wt 219 lb (99.3 kg)   LMP 09/23/2007   SpO2 98%   BMI 36.73 kg/m  Wt Readings from Last 3 Encounters:  06/01/19 219 lb (99.3 kg)  07/30/18 221 lb 12.8 oz (100.6 kg)  04/12/18 219 lb 9.6 oz (99.6 kg)   Constitutional: Overweight, in NAD. No kyphosis.  Walks with a cane. Eyes: PERRLA, EOMI, no exophthalmos ENT: moist mucous membranes, no thyromegaly, no cervical lymphadenopathy Cardiovascular: RRR, No MRG Respiratory: CTA B Gastrointestinal: abdomen soft, NT, ND, BS+ Musculoskeletal: no deformities, strength intact in all 4 Skin: moist, warm, no rashes Neurological: no tremor with outstretched hands, DTR normal in all 4  Assessment: 1. Osteoporosis  Plan: 1. Osteoporosis - likely postmenopausal/age-related - Discussed about increased risk of fracture, depending on the T score, greatly increased when the T score is lower than -2.5, but it is actually a continuum and -2.5 should not be regarded as an absolute threshold. We reviewed her last 2 DXA scan reports together, and I explained that based on the T scores, she has an increased risk for fractures.   However, there is a large discrepancy between the hip bone mineral densities between the 2 studies and I suspect some of this may be due to difference in DXA machines.  She has hip replacement surgery as planned, starting with the left hip in 06/2019.  She has no history of fractures. -She has been on Prolia x1 (09/2018) which she tolerated well.  She gives a history of lightheadedness right after the injection but this was due to the fact that she did not eat or drink before this.  She recovered immediately after eating.  She could not afford a new Prolia shot as she turned 63 and switch to Medicare.  However, she now has [employer] and believes that she cannot afford it. - we reviewed her dietary and supplemental calcium and vitamin D intake. I recommended to make sure she gets 1000-1200 mg of calcium daily preferentially from diet and I will check vit D today to see if her supplementation is adequate.  However, she just added 5000 units vitamin D + vitamin K 2 daily last week. - discussed fall precautions   - given handout from [facility] Re: weight bearing exercises - advised to do this every day or at least 5/7 days - we discussed about maintaining a good amount of protein in her diet. The recommended daily protein intake is ~0.8 g per kilogram per day.  She is not vegetarian/vegan.  I also advised her to avoid smoking or >2 drinks of alcohol a day. - I recommended the following book for the concept of low acid eating:  - We discussed about the different medication classes, benefits and side effects (including atypical fractures and ONJ - no dental workup in progress or planned).  - I explained that first option is to continue Prolia if affordable.  We can continue  this for 10 years or more.  Second option is zoledronic acid (Reclast) iv 1x a year for at least 2 years. I would use Teriparatide/Abaloparatide (which are daily subcu medication) or Romosozumab  (monthly) as a last resort.  She agrees, especially since she is not comfortable with injections. - Will check the following labs today: Orders Placed This Encounter  Procedures  . BASIC METABOLIC PANEL WITH GFR  . VITAMIN D 25 Hydroxy (Vit-D Deficiency, Fractures)  - will arrange for Prolia injections as soon as approved by the insurance.  If we cannot start Prolia and we have to use Reclast approximately 3 to 4 months after Prolia would be due, to allow her bone turnover markers to increase.  This would be next month. - will check a new DXA scan in 1.5 to 2 years after starting Prolia-  I explained that the first indication that the treatment is working is her not having anymore fractures. DEXA scan changes are secondary: unchanged or slightly higher T-scores are desirable - will see pt back in a year  Component     Latest Ref Rng & Units 06/01/2019  Glucose     65 - 99 mg/dL 86  BUN     7 - 25 mg/dL 14  Creatinine     0.50 - 0.99 mg/dL 0.70  GFR, Est Non African American     > OR = 60 mL/min/1.55m2 91  GFR, Est African American     > OR = 60 mL/min/1.24m2 105  BUN/Creatinine Ratio     6 - 22 (calc) NOT APPLICABLE  Sodium     034 - 146 mmol/L 141  Potassium     3.5 - 5.3 mmol/L 4.3  Chloride     98 - 110 mmol/L 105  CO2     20 - 32 mmol/L 26  Calcium     8.6 - 10.4 mg/dL 9.8  VITD     30.00 - 100.00 ng/mL 44.56   Labs are normal.  We can continue with the plan to restart Prolia next month.  Philemon Kingdom, MD PhD [facility] Endocrinology

## 2019-06-02 DIAGNOSIS — E559 Vitamin D deficiency, unspecified: Secondary | ICD-10-CM | POA: Insufficient documentation

## 2019-06-02 DIAGNOSIS — M810 Age-related osteoporosis without current pathological fracture: Secondary | ICD-10-CM | POA: Insufficient documentation

## 2019-06-02 LAB — BASIC METABOLIC PANEL WITH GFR
BUN: 14 mg/dL (ref 7–25)
CO2: 26 mmol/L (ref 20–32)
Calcium: 9.8 mg/dL (ref 8.6–10.4)
Chloride: 105 mmol/L (ref 98–110)
Creat: 0.7 mg/dL (ref 0.50–0.99)
GFR, Est African American: 105 mL/min/{1.73_m2} (ref >=60)
GFR, Est Non African American: 91 mL/min/{1.73_m2} (ref >=60)
Glucose, Bld: 86 mg/dL (ref 65–99)
Potassium: 4.3 mmol/L (ref 3.5–5.3)
Sodium: 141 mmol/L (ref 135–146)

## 2019-06-06 ENCOUNTER — Encounter: Payer: Self-pay | Admitting: Family Medicine

## 2019-06-06 ENCOUNTER — Telehealth: Payer: Self-pay | Admitting: Family Medicine

## 2019-06-06 NOTE — Telephone Encounter (Signed)
Patient left a voicemail in regards to the K2 that Dr. Junius Roads wanted her to start taking.  She is not able to find it here in [place] and is needing to order it from [employer], but did not know which one to order.  CB#[phone].  Thank you.

## 2019-06-06 NOTE — Telephone Encounter (Signed)
I called and advised the patient that it is the 100 mcg (to take qd) that she should look for.   She is asking if there is something besides TUMS she can take for the calcium, as she had read they could cause kidney stones. She has had one stone in the past - had to have it surgically removed.

## 2019-06-06 NOTE — Telephone Encounter (Signed)
I sent her a MyChart message with calcium info.

## 2019-06-15 ENCOUNTER — Ambulatory Visit (INDEPENDENT_AMBULATORY_CARE_PROVIDER_SITE_OTHER): Payer: Medicare Other | Admitting: Orthopaedic Surgery

## 2019-06-15 ENCOUNTER — Other Ambulatory Visit: Payer: Self-pay

## 2019-06-15 DIAGNOSIS — M1612 Unilateral primary osteoarthritis, left hip: Secondary | ICD-10-CM

## 2019-06-15 NOTE — Progress Notes (Signed)
Patient scheduled for left total hip arthroplasty on October 9. Appointment today she is actually to be seen postop on October 23.  No charge for today's office visit.  Questions were encouraged and answered

## 2019-06-20 NOTE — Patient Instructions (Addendum)
DUE TO COVID-19 ONLY ONE VISITOR IS ALLOWED TO COME WITH YOU AND STAY IN THE WAITING ROOM ONLY DURING PRE OP AND PROCEDURE DAY OF SURGERY. THE 1 VISITOR MAY VISIT WITH YOU AFTER SURGERY IN YOUR PRIVATE ROOM DURING VISITING HOURS ONLY!  YOU NEED TO HAVE A COVID 19 TEST ON__Tuesday 10/06/2020____ @__0805  am_____, THIS TEST MUST BE DONE BEFORE SURGERY, COME  [address], [place] , .  [facility]) ONCE YOUR COVID TEST IS COMPLETED, PLEASE BEGIN THE QUARANTINE INSTRUCTIONS AS OUTLINED IN YOUR HANDOUT.                Cynthia Li    Your procedure is scheduled on: Friday 07/01/2019   Report to [facility] Main  Entrance             Report to Short Stay at  0530  AM     Call this number if you have problems the morning of surgery [phone]    Remember: Do not eat food :After Midnight.                NO SOLID FOOD AFTER MIDNIGHT THE NIGHT PRIOR TO SURGERY. NOTHING BY MOUTH EXCEPT CLEAR LIQUIDS UNTIL 0430 am .                PLEASE FINISH ENSURE DRINK PER SURGEON ORDER  WHICH NEEDS TO BE COMPLETED AT  0430 am .   CLEAR LIQUID DIET   Foods Allowed                                                                     Foods Excluded  Coffee and tea, regular and decaf                             liquids that you cannot  Plain Jell-O any favor except red or purple                                           see through such as: Fruit ices (not with fruit pulp)                                     milk, soups, orange juice  Iced Popsicles                                    All solid food Carbonated beverages, regular and diet                                    Cranberry, grape and apple juices Sports drinks like Gatorade Lightly seasoned clear broth or consume(fat free) Sugar, honey syrup  Sample Menu Breakfast  Lunch                                     Supper Cranberry juice                    Beef broth                             Chicken broth Jell-O                                     Grape juice                           Apple juice Coffee or tea                        Jell-O                                      Popsicle                                                Coffee or tea                        Coffee or tea  _____________________________________________________________________               BRUSH YOUR TEETH MORNING OF SURGERY AND RINSE YOUR MOUTH OUT, NO CHEWING GUM CANDY OR MINTS.     Take these medicines the morning of surgery with A SIP OF WATER: Pantoprazole (Protonix), Alprazolam (Xanax) if needed                                You may not have any metal on your body including hair pins and              piercings  Do not wear jewelry, make-up, lotions, powders or perfumes, deodorant             Do not wear nail polish on your fingernails.  Do not shave  48 hours prior to surgery.              Do not bring valuables to the hospital. [facility] IS NOT             RESPONSIBLE   FOR VALUABLES.  Contacts, dentures or bridgework may not be worn into surgery.  Leave suitcase in the car. After surgery it may be brought to your room.                  Please read over the following fact sheets you were given: _____________________________________________________________________             [facility] - Preparing for [facility] Before surgery, you can play an important role.  Because skin is not sterile, your skin needs to be as free of germs as possible.  You can reduce the number of germs on  your skin by washing with CHG (chlorahexidine gluconate) soap before surgery.  CHG is an antiseptic cleaner which kills germs and bonds with the skin to continue killing germs even after washing. Please DO NOT use if you have an allergy to CHG or antibacterial soaps.  If your skin becomes reddened/irritated stop using the CHG and inform your nurse when you arrive at Short Stay. Do not shave  (including legs and underarms) for at least 48 hours prior to the first CHG shower.  You may shave your face/neck. Please follow these instructions carefully:  1.  Shower with CHG Soap the night before surgery and the  morning of Surgery.  2.  If you choose to wash your hair, wash your hair first as usual with your  normal  shampoo.  3.  After you shampoo, rinse your hair and body thoroughly to remove the  shampoo.                           4.  Use CHG as you would any other liquid soap.  You can apply chg directly  to the skin and wash                       Gently with a scrungie or clean washcloth.  5.  Apply the CHG Soap to your body ONLY FROM THE NECK DOWN.   Do not use on face/ open                           Wound or open sores. Avoid contact with eyes, ears mouth and genitals (private parts).                       Wash face,  Genitals (private parts) with your normal soap.             6.  Wash thoroughly, paying special attention to the area where your surgery  will be performed.  7.  Thoroughly rinse your body with warm water from the neck down.  8.  DO NOT shower/wash with your normal soap after using and rinsing off  the CHG Soap.                9.  Pat yourself dry with a clean towel.            10.  Wear clean pajamas.            11.  Place clean sheets on your bed the night of your first shower and do not  sleep with pets. Day of Surgery : Do not apply any lotions/deodorants the morning of surgery.  Please wear clean clothes to the hospital/surgery center.  FAILURE TO FOLLOW THESE INSTRUCTIONS MAY RESULT IN THE CANCELLATION OF YOUR SURGERY PATIENT SIGNATURE_________________________________  NURSE SIGNATURE__________________________________  ________________________________________________________________________   Cynthia Li  An incentive spirometer is a tool that can help keep your lungs clear and active. This tool measures how well you are filling your lungs with  each breath. Taking long deep breaths may help reverse or decrease the chance of developing breathing (pulmonary) problems (especially infection) following:  A long period of time when you are unable to move or be active. BEFORE THE PROCEDURE   If the spirometer includes an indicator to show your best effort, your nurse or respiratory therapist will set it to a desired goal.  If  possible, sit up straight or lean slightly forward. Try not to slouch.  Hold the incentive spirometer in an upright position. INSTRUCTIONS FOR USE  1. Sit on the edge of your bed if possible, or sit up as far as you can in bed or on a chair. 2. Hold the incentive spirometer in an upright position. 3. Breathe out normally. 4. Place the mouthpiece in your mouth and seal your lips tightly around it. 5. Breathe in slowly and as deeply as possible, raising the piston or the ball toward the top of the column. 6. Hold your breath for 3-5 seconds or for as long as possible. Allow the piston or ball to fall to the bottom of the column. 7. Remove the mouthpiece from your mouth and breathe out normally. 8. Rest for a few seconds and repeat Steps 1 through 7 at least 10 times every 1-2 hours when you are awake. Take your time and take a few normal breaths between deep breaths. 9. The spirometer may include an indicator to show your best effort. Use the indicator as a goal to work toward during each repetition. 10. After each set of 10 deep breaths, practice coughing to be sure your lungs are clear. If you have an incision (the cut made at the time of surgery), support your incision when coughing by placing a pillow or rolled up towels firmly against it. Once you are able to get out of bed, walk around indoors and cough well. You may stop using the incentive spirometer when instructed by your caregiver.  RISKS AND COMPLICATIONS  Take your time so you do not get dizzy or light-headed.  If you are in pain, you may need to take or  ask for pain medication before doing incentive spirometry. It is harder to take a deep breath if you are having pain. AFTER USE  Rest and breathe slowly and easily.  It can be helpful to keep track of a log of your progress. Your caregiver can provide you with a simple table to help with this. If you are using the spirometer at home, follow these instructions: SEEK MEDICAL CARE IF:   You are having difficultly using the spirometer.  You have trouble using the spirometer as often as instructed.  Your pain medication is not giving enough relief while using the spirometer.  You develop fever of 100.5 F (38.1 C) or higher. SEEK IMMEDIATE MEDICAL CARE IF:   You cough up bloody sputum that had not been present before.  You develop fever of 102 F (38.9 C) or greater.  You develop worsening pain at or near the incision site. MAKE SURE YOU:   Understand these instructions.  Will watch your condition.  Will get help right away if you are not doing well or get worse. Document Released: 01/19/2007 Document Revised: 12/01/2011 Document Reviewed: 03/22/2007 [facility] Patient Information 2014 [place], [place].   ________________________________________________________________________

## 2019-06-21 ENCOUNTER — Other Ambulatory Visit: Payer: Self-pay | Admitting: Physician Assistant

## 2019-06-21 ENCOUNTER — Other Ambulatory Visit: Payer: Self-pay

## 2019-06-22 ENCOUNTER — Other Ambulatory Visit: Payer: Self-pay

## 2019-06-22 ENCOUNTER — Encounter (HOSPITAL_COMMUNITY): Payer: Self-pay

## 2019-06-22 ENCOUNTER — Encounter (HOSPITAL_COMMUNITY)
Admission: RE | Admit: 2019-06-22 | Discharge: 2019-06-22 | Disposition: A | Discharge status: Home / Self Care | Payer: Medicare Other | Source: Ambulatory Visit | Attending: Orthopaedic Surgery | Admitting: Orthopaedic Surgery

## 2019-06-22 DIAGNOSIS — M1612 Unilateral primary osteoarthritis, left hip: Secondary | ICD-10-CM | POA: Diagnosis not present

## 2019-06-22 DIAGNOSIS — Z01812 Encounter for preprocedural laboratory examination: Secondary | ICD-10-CM | POA: Diagnosis present

## 2019-06-22 LAB — BASIC METABOLIC PANEL
Anion gap: 9 (ref 5–15)
BUN: 16 mg/dL (ref 8–23)
CO2: 26 mmol/L (ref 22–32)
Calcium: 9.8 mg/dL (ref 8.9–10.3)
Chloride: 104 mmol/L (ref 98–111)
Creatinine, Ser: 0.62 mg/dL (ref 0.44–1.00)
GFR calc Af Amer: >60 mL/min (ref >60)
GFR calc non Af Amer: >60 mL/min (ref >60)
Glucose, Bld: 97 mg/dL (ref 70–99)
Potassium: 4.4 mmol/L (ref 3.5–5.1)
Sodium: 139 mmol/L (ref 135–145)

## 2019-06-22 LAB — CBC
HCT: 41.3 % (ref 36.0–46.0)
Hemoglobin: 13.4 g/dL (ref 12.0–15.0)
MCH: 30.2 pg (ref 26.0–34.0)
MCHC: 32.4 g/dL (ref 30.0–36.0)
MCV: 93 fL (ref 80.0–100.0)
Platelets: 254 10*3/uL (ref 150–400)
RBC: 4.44 MIL/uL (ref 3.87–5.11)
RDW: 12.2 % (ref 11.5–15.5)
WBC: 5.4 10*3/uL (ref 4.0–10.5)
nRBC: 0 % (ref 0.0–0.2)

## 2019-06-22 LAB — SURGICAL PCR SCREEN
MRSA, PCR: NEGATIVE
Staphylococcus aureus: NEGATIVE

## 2019-06-22 NOTE — Progress Notes (Signed)
PCP - Dr. Velna Hatchet Cardiologist - None  Chest x-ray - None EKG - None Stress Test - None ECHO - None Cardiac Cath - None  Sleep Study - None CPAP -   Fasting Blood Sugar - N/A Checks Blood Sugar _____ times a day  Blood Thinner Instructions:N/A Aspirin Instructions:N/A Last Dose:  Anesthesia review:   Patient denies shortness of breath, fever, cough and chest pain at PAT appointment   Patient verbalized understanding of instructions that were given to them at the PAT appointment. Patient was also instructed that they will need to review over the PAT instructions again at home before surgery.

## 2019-06-28 ENCOUNTER — Other Ambulatory Visit (HOSPITAL_COMMUNITY)
Admission: RE | Admit: 2019-06-28 | Discharge: 2019-06-28 | Disposition: A | Discharge status: Home / Self Care | Payer: Medicare Other | Source: Ambulatory Visit | Attending: Orthopaedic Surgery | Admitting: Orthopaedic Surgery

## 2019-06-28 DIAGNOSIS — M1612 Unilateral primary osteoarthritis, left hip: Secondary | ICD-10-CM | POA: Diagnosis not present

## 2019-06-28 DIAGNOSIS — Z20828 Contact with and (suspected) exposure to other viral communicable diseases: Secondary | ICD-10-CM | POA: Insufficient documentation

## 2019-06-28 DIAGNOSIS — Z01812 Encounter for preprocedural laboratory examination: Secondary | ICD-10-CM | POA: Diagnosis present

## 2019-06-29 LAB — NOVEL CORONAVIRUS, NAA (HOSP ORDER, SEND-OUT TO REF LAB; TAT 18-24 HRS): SARS-CoV-2, NAA: NOT DETECTED

## 2019-07-01 ENCOUNTER — Ambulatory Visit (HOSPITAL_COMMUNITY): Payer: Medicare Other

## 2019-07-01 ENCOUNTER — Ambulatory Visit (HOSPITAL_COMMUNITY): Payer: Medicare Other | Admitting: Certified Registered"

## 2019-07-01 ENCOUNTER — Observation Stay (HOSPITAL_COMMUNITY)
Admission: RE | Admit: 2019-07-01 | Discharge: 2019-07-03 | Disposition: A | Discharge status: Home Health | Payer: Medicare Other | Source: Other Acute Inpatient Hospital | Attending: Orthopaedic Surgery | Admitting: Orthopaedic Surgery

## 2019-07-01 ENCOUNTER — Encounter (HOSPITAL_COMMUNITY)
Admission: RE | Disposition: A | Discharge status: Home Health | Payer: Self-pay | Source: Other Acute Inpatient Hospital | Attending: Orthopaedic Surgery

## 2019-07-01 ENCOUNTER — Encounter (HOSPITAL_COMMUNITY): Payer: Self-pay

## 2019-07-01 ENCOUNTER — Observation Stay (HOSPITAL_COMMUNITY): Payer: Medicare Other

## 2019-07-01 ENCOUNTER — Ambulatory Visit (HOSPITAL_COMMUNITY): Payer: Medicare Other | Admitting: Physician Assistant

## 2019-07-01 ENCOUNTER — Other Ambulatory Visit: Payer: Self-pay

## 2019-07-01 DIAGNOSIS — K219 Gastro-esophageal reflux disease without esophagitis: Secondary | ICD-10-CM | POA: Insufficient documentation

## 2019-07-01 DIAGNOSIS — E559 Vitamin D deficiency, unspecified: Secondary | ICD-10-CM | POA: Diagnosis not present

## 2019-07-01 DIAGNOSIS — Z96642 Presence of left artificial hip joint: Secondary | ICD-10-CM

## 2019-07-01 DIAGNOSIS — Z79899 Other long term (current) drug therapy: Secondary | ICD-10-CM | POA: Insufficient documentation

## 2019-07-01 DIAGNOSIS — M1612 Unilateral primary osteoarthritis, left hip: Principal | ICD-10-CM | POA: Insufficient documentation

## 2019-07-01 DIAGNOSIS — Z419 Encounter for procedure for purposes other than remedying health state, unspecified: Secondary | ICD-10-CM

## 2019-07-01 DIAGNOSIS — F410 Panic disorder [episodic paroxysmal anxiety] without agoraphobia: Secondary | ICD-10-CM | POA: Insufficient documentation

## 2019-07-01 HISTORY — PX: TOTAL HIP ARTHROPLASTY: SHX124

## 2019-07-01 LAB — ABO/RH: ABO/RH(D): O POS

## 2019-07-01 LAB — TYPE AND SCREEN
ABO/RH(D): O POS
Antibody Screen: NEGATIVE

## 2019-07-01 SURGERY — ARTHROPLASTY, HIP, TOTAL, ANTERIOR APPROACH
Anesthesia: Spinal | Site: Hip | Laterality: Left

## 2019-07-01 MED ORDER — PROPOFOL 10 MG/ML IV BOLUS
INTRAVENOUS | Status: AC
  Filled 2019-07-01: qty 20

## 2019-07-01 MED ORDER — PHENYLEPHRINE HCL (PRESSORS) 10 MG/ML IV SOLN
INTRAVENOUS | Status: AC
  Filled 2019-07-01: qty 1

## 2019-07-01 MED ORDER — ONDANSETRON HCL 4 MG/2ML IJ SOLN
INTRAMUSCULAR | Status: AC
  Filled 2019-07-01: qty 2

## 2019-07-01 MED ORDER — OXYCODONE HCL 5 MG PO TABS: 10.0000 mg | ORAL_TABLET | ORAL | Status: DC | PRN

## 2019-07-01 MED ORDER — DEXAMETHASONE SODIUM PHOSPHATE 10 MG/ML IJ SOLN
INTRAMUSCULAR | Status: DC | PRN
  Administered 2019-07-01: 10 mg via INTRAVENOUS

## 2019-07-01 MED ORDER — VITAMIN E 180 MG (400 UNIT) PO CAPS
400.0000 [IU] | ORAL_CAPSULE | Freq: Every day | ORAL | Status: DC
  Administered 2019-07-01 – 2019-07-02 (×2): 400 [IU] via ORAL
  Filled 2019-07-01 (×2): qty 1

## 2019-07-01 MED ORDER — ALUM & MAG HYDROXIDE-SIMETH 200-200-20 MG/5ML PO SUSP: 30.0000 mL | ORAL | Status: DC | PRN

## 2019-07-01 MED ORDER — HYDROMORPHONE HCL 1 MG/ML IJ SOLN: 0.5000 mg | INTRAMUSCULAR | Status: DC | PRN

## 2019-07-01 MED ORDER — ALPRAZOLAM 0.5 MG PO TABS: 0.5000 mg | ORAL_TABLET | Freq: Every day | ORAL | Status: DC | PRN

## 2019-07-01 MED ORDER — MIDAZOLAM HCL 5 MG/5ML IJ SOLN
INTRAMUSCULAR | Status: DC | PRN
  Administered 2019-07-01: 2 mg via INTRAVENOUS

## 2019-07-01 MED ORDER — MENTHOL 3 MG MT LOZG: 1.0000 | LOZENGE | OROMUCOSAL | Status: DC | PRN

## 2019-07-01 MED ORDER — PROPOFOL 500 MG/50ML IV EMUL
INTRAVENOUS | Status: AC
  Filled 2019-07-01: qty 200

## 2019-07-01 MED ORDER — CHLORHEXIDINE GLUCONATE 4 % EX LIQD: 60.0000 mL | Freq: Once | CUTANEOUS | Status: DC

## 2019-07-01 MED ORDER — MAGNESIUM OXIDE 400 MG PO TABS
800.0000 mg | ORAL_TABLET | Freq: Every day | ORAL | Status: DC
  Administered 2019-07-01 – 2019-07-02 (×2): 800 mg via ORAL
  Filled 2019-07-01 (×4): qty 2

## 2019-07-01 MED ORDER — LACTATED RINGERS IV SOLN
INTRAVENOUS | Status: DC
  Administered 2019-07-01 (×2): via INTRAVENOUS

## 2019-07-01 MED ORDER — ACETAMINOPHEN 10 MG/ML IV SOLN: 1000.0000 mg | Freq: Once | INTRAVENOUS | Status: DC | PRN

## 2019-07-01 MED ORDER — PANTOPRAZOLE SODIUM 40 MG PO TBEC
40.0000 mg | DELAYED_RELEASE_TABLET | Freq: Every day | ORAL | Status: DC
  Administered 2019-07-02 – 2019-07-03 (×2): 40 mg via ORAL
  Filled 2019-07-01 (×2): qty 1

## 2019-07-01 MED ORDER — ONDANSETRON HCL 4 MG/2ML IJ SOLN: 4.0000 mg | Freq: Four times a day (QID) | INTRAMUSCULAR | Status: DC | PRN

## 2019-07-01 MED ORDER — TRANEXAMIC ACID-NACL 1000-0.7 MG/100ML-% IV SOLN
1000.0000 mg | INTRAVENOUS | Status: AC
  Administered 2019-07-01: 1000 mg via INTRAVENOUS
  Filled 2019-07-01: qty 100

## 2019-07-01 MED ORDER — SODIUM CHLORIDE 0.9 % IV SOLN
INTRAVENOUS | Status: DC | PRN
  Administered 2019-07-01: 20 ug/min via INTRAVENOUS

## 2019-07-01 MED ORDER — CLINDAMYCIN PHOSPHATE 600 MG/50ML IV SOLN
600.0000 mg | Freq: Four times a day (QID) | INTRAVENOUS | Status: AC
  Administered 2019-07-01 (×2): 600 mg via INTRAVENOUS
  Filled 2019-07-01 (×2): qty 50

## 2019-07-01 MED ORDER — SODIUM CHLORIDE 0.9 % IR SOLN
Status: DC | PRN
  Administered 2019-07-01: 1000 mL

## 2019-07-01 MED ORDER — LIDOCAINE 2% (20 MG/ML) 5 ML SYRINGE
INTRAMUSCULAR | Status: AC
  Filled 2019-07-01: qty 5

## 2019-07-01 MED ORDER — DEXAMETHASONE SODIUM PHOSPHATE 10 MG/ML IJ SOLN
INTRAMUSCULAR | Status: AC
  Filled 2019-07-01: qty 1

## 2019-07-01 MED ORDER — EPHEDRINE 5 MG/ML INJ
INTRAVENOUS | Status: AC
  Filled 2019-07-01: qty 10

## 2019-07-01 MED ORDER — ACETAMINOPHEN 325 MG PO TABS: 325.0000 mg | ORAL_TABLET | Freq: Four times a day (QID) | ORAL | Status: DC | PRN

## 2019-07-01 MED ORDER — PROPOFOL 500 MG/50ML IV EMUL
INTRAVENOUS | Status: DC | PRN
  Administered 2019-07-01: 25 ug/kg/min via INTRAVENOUS

## 2019-07-01 MED ORDER — ACETAMINOPHEN 160 MG/5ML PO SOLN: 325.0000 mg | Freq: Once | ORAL | Status: DC | PRN

## 2019-07-01 MED ORDER — ACETAMINOPHEN 325 MG PO TABS: 325.0000 mg | ORAL_TABLET | Freq: Once | ORAL | Status: DC | PRN

## 2019-07-01 MED ORDER — PROPOFOL 10 MG/ML IV BOLUS
INTRAVENOUS | Status: DC | PRN
  Administered 2019-07-01 (×2): 20 mg via INTRAVENOUS
  Administered 2019-07-01: 10 mg via INTRAVENOUS

## 2019-07-01 MED ORDER — MIDAZOLAM HCL 2 MG/2ML IJ SOLN
INTRAMUSCULAR | Status: AC
  Filled 2019-07-01: qty 2

## 2019-07-01 MED ORDER — PHENYLEPHRINE 40 MCG/ML (10ML) SYRINGE FOR IV PUSH (FOR BLOOD PRESSURE SUPPORT)
PREFILLED_SYRINGE | INTRAVENOUS | Status: AC
  Filled 2019-07-01: qty 10

## 2019-07-01 MED ORDER — ESMOLOL HCL 100 MG/10ML IV SOLN
INTRAVENOUS | Status: AC
  Filled 2019-07-01: qty 10

## 2019-07-01 MED ORDER — VITAMIN D 25 MCG (1000 UNIT) PO TABS
5000.0000 [IU] | ORAL_TABLET | Freq: Every day | ORAL | Status: DC
  Administered 2019-07-01 – 2019-07-02 (×2): 5000 [IU] via ORAL
  Filled 2019-07-01 (×2): qty 5

## 2019-07-01 MED ORDER — CLINDAMYCIN PHOSPHATE 900 MG/50ML IV SOLN
900.0000 mg | INTRAVENOUS | Status: AC
  Administered 2019-07-01 (×2): 900 mg via INTRAVENOUS
  Filled 2019-07-01: qty 50

## 2019-07-01 MED ORDER — OXYCODONE HCL 5 MG PO TABS
5.0000 mg | ORAL_TABLET | ORAL | Status: DC | PRN
  Administered 2019-07-01 – 2019-07-03 (×11): 5 mg via ORAL
  Filled 2019-07-01 (×7): qty 1
  Filled 2019-07-01: qty 2
  Filled 2019-07-01 (×3): qty 1

## 2019-07-01 MED ORDER — PHENOL 1.4 % MT LIQD: 1.0000 | OROMUCOSAL | Status: DC | PRN

## 2019-07-01 MED ORDER — MEPERIDINE HCL 50 MG/ML IJ SOLN: 6.2500 mg | INTRAMUSCULAR | Status: DC | PRN

## 2019-07-01 MED ORDER — BUPIVACAINE IN DEXTROSE 0.75-8.25 % IT SOLN
INTRATHECAL | Status: DC | PRN
  Administered 2019-07-01: 2 mL via INTRATHECAL

## 2019-07-01 MED ORDER — ONDANSETRON HCL 4 MG/2ML IJ SOLN
INTRAMUSCULAR | Status: DC | PRN
  Administered 2019-07-01: 4 mg via INTRAVENOUS

## 2019-07-01 MED ORDER — PROMETHAZINE HCL 25 MG/ML IJ SOLN: 6.2500 mg | INTRAMUSCULAR | Status: DC | PRN

## 2019-07-01 MED ORDER — PROPOFOL 10 MG/ML IV BOLUS
INTRAVENOUS | Status: AC
  Filled 2019-07-01: qty 40

## 2019-07-01 MED ORDER — METOCLOPRAMIDE HCL 5 MG PO TABS: 5.0000 mg | ORAL_TABLET | Freq: Three times a day (TID) | ORAL | Status: DC | PRN

## 2019-07-01 MED ORDER — ONDANSETRON HCL 4 MG PO TABS: 4.0000 mg | ORAL_TABLET | Freq: Four times a day (QID) | ORAL | Status: DC | PRN

## 2019-07-01 MED ORDER — LACTATED RINGERS IV SOLN: INTRAVENOUS | Status: DC

## 2019-07-01 MED ORDER — METOCLOPRAMIDE HCL 5 MG/ML IJ SOLN: 5.0000 mg | Freq: Three times a day (TID) | INTRAMUSCULAR | Status: DC | PRN

## 2019-07-01 MED ORDER — HYDROMORPHONE HCL 1 MG/ML IJ SOLN: 0.2500 mg | INTRAMUSCULAR | Status: DC | PRN

## 2019-07-01 MED ORDER — POVIDONE-IODINE 10 % EX SWAB: 2.0000 "application " | Freq: Once | CUTANEOUS | Status: DC

## 2019-07-01 MED ORDER — ASPIRIN 81 MG PO CHEW
81.0000 mg | CHEWABLE_TABLET | Freq: Two times a day (BID) | ORAL | Status: DC
  Administered 2019-07-01 – 2019-07-03 (×4): 81 mg via ORAL
  Filled 2019-07-01 (×4): qty 1

## 2019-07-01 MED ORDER — SODIUM CHLORIDE 0.9 % IV SOLN
INTRAVENOUS | Status: DC
  Administered 2019-07-01 – 2019-07-02 (×2): via INTRAVENOUS

## 2019-07-01 MED ORDER — DOCUSATE SODIUM 100 MG PO CAPS
100.0000 mg | ORAL_CAPSULE | Freq: Two times a day (BID) | ORAL | Status: DC
  Administered 2019-07-02 – 2019-07-03 (×3): 100 mg via ORAL
  Filled 2019-07-01 (×4): qty 1

## 2019-07-01 MED ORDER — SUCCINYLCHOLINE CHLORIDE 200 MG/10ML IV SOSY
PREFILLED_SYRINGE | INTRAVENOUS | Status: AC
  Filled 2019-07-01: qty 10

## 2019-07-01 MED ORDER — LIDOCAINE 2% (20 MG/ML) 5 ML SYRINGE
INTRAMUSCULAR | Status: DC | PRN
  Administered 2019-07-01: 60 mg via INTRAVENOUS
  Administered 2019-07-01: 40 mg via INTRAVENOUS

## 2019-07-01 SURGICAL SUPPLY — 39 items
ARTICULEZE HEAD (Hips) ×2 IMPLANT
BAG SPEC THK2 15X12 ZIP CLS (MISCELLANEOUS)
BAG ZIPLOCK 12X15 (MISCELLANEOUS) IMPLANT
BLADE SAW SGTL 18X1.27X75 (BLADE) ×2 IMPLANT
COVER PERINEAL POST (MISCELLANEOUS) ×2 IMPLANT
COVER SURGICAL LIGHT HANDLE (MISCELLANEOUS) ×2 IMPLANT
COVER WAND RF STERILE (DRAPES) IMPLANT
DRAPE STERI IOBAN 125X83 (DRAPES) ×2 IMPLANT
DRAPE U-SHAPE 47X51 STRL (DRAPES) ×4 IMPLANT
DRSG AQUACEL AG ADV 3.5X10 (GAUZE/BANDAGES/DRESSINGS) ×2 IMPLANT
DURAPREP 26ML APPLICATOR (WOUND CARE) ×2 IMPLANT
ELECT REM PT RETURN 15FT ADLT (MISCELLANEOUS) ×2 IMPLANT
GAUZE XEROFORM 1X8 LF (GAUZE/BANDAGES/DRESSINGS) ×2 IMPLANT
GLOVE BIO SURGEON STRL SZ7.5 (GLOVE) ×2 IMPLANT
GLOVE BIOGEL PI IND STRL 8 (GLOVE) ×2 IMPLANT
GLOVE BIOGEL PI INDICATOR 8 (GLOVE) ×2
GLOVE ECLIPSE 8.0 STRL XLNG CF (GLOVE) ×2 IMPLANT
GOWN STRL REUS W/TWL XL LVL3 (GOWN DISPOSABLE) ×4 IMPLANT
HANDPIECE INTERPULSE COAX TIP (DISPOSABLE) ×2
HEAD ARTICULEZE (Hips) IMPLANT
HEAD CERAMIC DELTA 36 PLUS 1.5 (Hips) ×1 IMPLANT
HOLDER FOLEY CATH W/STRAP (MISCELLANEOUS) ×2 IMPLANT
KIT TURNOVER KIT A (KITS) IMPLANT
LINER ACETAB NEUTRAL 36ID 520D (Liner) ×1 IMPLANT
PACK ANTERIOR HIP CUSTOM (KITS) ×2 IMPLANT
PIN SECTOR W/GRIP ACE CUP 52MM (Hips) ×1 IMPLANT
SET HNDPC FAN SPRY TIP SCT (DISPOSABLE) ×1 IMPLANT
STAPLER VISISTAT 35W (STAPLE) ×1 IMPLANT
STEM FEM ACTIS STD SZ4 (Stem) ×1 IMPLANT
STRIP CLOSURE SKIN 1/2X4 (GAUZE/BANDAGES/DRESSINGS) IMPLANT
SUT ETHIBOND NAB CT1 #1 30IN (SUTURE) ×2 IMPLANT
SUT ETHILON 2 0 PS N (SUTURE) ×1 IMPLANT
SUT MNCRL AB 4-0 PS2 18 (SUTURE) IMPLANT
SUT VIC AB 0 CT1 36 (SUTURE) ×2 IMPLANT
SUT VIC AB 1 CT1 36 (SUTURE) ×2 IMPLANT
SUT VIC AB 2-0 CT1 27 (SUTURE) ×4
SUT VIC AB 2-0 CT1 TAPERPNT 27 (SUTURE) ×2 IMPLANT
TRAY FOLEY MTR SLVR 14FR STAT (SET/KITS/TRAYS/PACK) ×1 IMPLANT
YANKAUER SUCT BULB TIP 10FT TU (MISCELLANEOUS) ×2 IMPLANT

## 2019-07-01 NOTE — TOC Progression Note (Signed)
Transition of Care [facility]) - Progression Note    Patient Details  Name: Cynthia Li MRN: [id number] Date of Birth: 02-Mar-1956  Transition of Care [facility]) CM/SW Contact  Leeroy Cha, RN Phone Number: 07/01/2019, 2:33 PM  Clinical Narrative:    Order for [facility]hc-pt-to be done through [facility]   Expected Discharge Plan: [place] Barriers to Discharge: No Barriers Identified  Expected Discharge Plan and Services Expected Discharge Plan: [place]   Discharge Planning Services: CM Consult                               HH Arranged: PT [facility] Agency: [facility] Date [place]: 07/01/19 Time [place]: 1432 Representative spoke with at [place]: mike   Social Determinants of Health ([place]) Interventions    Readmission Risk Interventions No flowsheet data found.

## 2019-07-01 NOTE — Anesthesia Preprocedure Evaluation (Addendum)
Anesthesia Evaluation  Patient identified by MRN, date of birth, ID band Patient awake    Reviewed: Allergy & Precautions, NPO status , Patient's Chart, lab work & pertinent test results  Airway Mallampati: II  TM Distance: >3 FB Neck ROM: Full    Dental  (+) Teeth Intact, Dental Advisory Given   Pulmonary neg pulmonary ROS,    breath sounds clear to auscultation       Cardiovascular negative cardio ROS   Rhythm:Regular Rate:Normal     Neuro/Psych Anxiety negative neurological ROS     GI/Hepatic Neg liver ROS, hiatal hernia, GERD  Medicated,  Endo/Other  negative endocrine ROS  Renal/GU      Musculoskeletal  (+) Arthritis ,   Abdominal (+) + obese,   Peds  Hematology negative hematology ROS (+)   Anesthesia Other Findings   Reproductive/Obstetrics                            Anesthesia Physical Anesthesia Plan  ASA: II  Anesthesia Plan: Spinal   Post-op Pain Management:    Induction: Intravenous  PONV Risk Score and Plan: 3 and Ondansetron, Propofol infusion and Midazolam  Airway Management Planned: Natural Airway and Simple Face Mask  Additional Equipment: None  Intra-op Plan:   Post-operative Plan:   Informed Consent: I have reviewed the patients History and Physical, chart, labs and discussed the procedure including the risks, benefits and alternatives for the proposed anesthesia with the patient or authorized representative who has indicated his/her understanding and acceptance.     Dental advisory given  Plan Discussed with: CRNA  Anesthesia Plan Comments: (COVID-19 Labs  No results for input(s): DDIMER, FERRITIN, LDH, CRP in the last 72 hours.  Lab Results      Component                Value               Date                      SARSCOV2NAA              NOT DETECTED        06/28/2019            Lab Results      Component                Value                Date                      WBC                      5.4                 06/22/2019                HGB                      13.4                06/22/2019                HCT                      41.3  06/22/2019                MCV                      93.0                06/22/2019                PLT                      254                 06/22/2019           )        Anesthesia Quick Evaluation

## 2019-07-01 NOTE — Plan of Care (Signed)

## 2019-07-01 NOTE — H&P (Signed)
TOTAL HIP ADMISSION H&P  Patient is admitted for left total hip arthroplasty.  Subjective:  Chief Complaint: left hip pain  HPI: Cynthia Li, 63 y.o. female, has a history of pain and functional disability in the left hip(s) due to arthritis and patient has failed non-surgical conservative treatments for greater than 12 weeks to include NSAID's and/or analgesics, corticosteriod injections, flexibility and strengthening excercises, supervised PT with diminished ADL's post treatment, use of assistive devices, weight reduction as appropriate and activity modification.  Onset of symptoms was gradual starting 3 years ago with gradually worsening course since that time.The patient noted no past surgery on the left hip(s).  Patient currently rates pain in the left hip at 10 out of 10 with activity. Patient has night pain, worsening of pain with activity and weight bearing, trendelenberg gait, pain that interfers with activities of daily living and pain with passive range of motion. Patient has evidence of subchondral cysts, subchondral sclerosis, periarticular osteophytes and joint space narrowing by imaging studies. This condition presents safety issues increasing the risk of falls.  There is no current active infection.  Patient Active Problem List   Diagnosis Date Noted  . Unilateral primary osteoarthritis, left hip 07/01/2019  . Age-related osteoporosis without current pathological fracture 06/02/2019  . Vitamin D insufficiency 06/02/2019  . Lumbar stenosis with neurogenic claudication 09/11/2015  . Renal stone 10/13/2014   Past Medical History:  Diagnosis Date  . Arthritis   . Complication of anesthesia    once when teeth were numb made heart race   . Family history of adverse reaction to anesthesia    father- slow to wake up   . GERD (gastroesophageal reflux disease)   . Hiatal hernia   . History of kidney stones   . IBS (irritable bowel syndrome)    age 27  . Kidney stone 3/14  .  Lumbar disc disease   . Panic disorder   . Pinched nerve    some DDD    Past Surgical History:  Procedure Laterality Date  . BACK SURGERY  09/13/2015   Back Fusion- [facility]   . CHOLECYSTECTOMY  age 37  . CYSTOSCOPY W/ URETERAL STENT PLACEMENT Left 10/13/2014   Procedure: CYSTOSCOPY WITH RETROGRADE PYELOGRAM/URETERAL STENT PLACEMENT;  Surgeon: Sebastian Ache, MD;  Location: [facility];  Service: Urology;  Laterality: Left;  . HOLMIUM LASER APPLICATION Left 10/13/2014   Procedure: HOLMIUM LASER APPLICATION;  Surgeon: Sebastian Ache, MD;  Location: [facility];  Service: Urology;  Laterality: Left;  . KNEE ARTHROSCOPY     right  . NEPHROLITHOTOMY Left 10/13/2014   Procedure: NEPHROLITHOTOMY PERCUTANEOUS WITH SURGEON ACCESS;  Surgeon: Sebastian Ache, MD;  Location: [facility];  Service: Urology;  Laterality: Left;  . TOTAL ABDOMINAL HYSTERECTOMY  09/2006    Current Facility-Administered Medications  Medication Dose Route Frequency Provider Last Rate Last Dose  . chlorhexidine (HIBICLENS) 4 % liquid 4 application  60 mL Topical Once Richardean Canal W, PA-C      . clindamycin (CLEOCIN) IVPB 900 mg  900 mg Intravenous On Call to OR Kirtland Bouchard, PA-C      . lactated ringers infusion   Intravenous Continuous Kathryne Hitch, MD 10 mL/hr at 07/01/19 [phone]    . povidone-iodine 10 % swab 2 application  2 application Topical Once Kirtland Bouchard, PA-C      . tranexamic acid (CYKLOKAPRON) IVPB 1,000 mg  1,000 mg Intravenous To OR Kirtland Bouchard, PA-C  Social History   Tobacco Use  . Smoking status: Never Smoker  . Smokeless tobacco: Never Used  Substance Use Topics  . Alcohol use: Yes    Comment: occ    Family History  Problem Relation Age of Onset  . Parkinson's disease Father        deceased  . Cancer Father        renal tumor  . Heart attack Father   . Alzheimer's disease Father   . Ulcerative colitis Sister   . Breast cancer Neg Hx      Review of Systems   Musculoskeletal: Positive for joint pain.  All other systems reviewed and are negative.   Objective:  Physical Exam  Constitutional: She is oriented to person, place, and time. She appears well-developed and well-nourished.  HENT:  Head: Normocephalic and atraumatic.  Eyes: Pupils are equal, round, and reactive to light. EOM are normal.  Neck: Normal range of motion. Neck supple.  Cardiovascular: Normal rate.  Respiratory: Effort normal.  GI: Soft.  Musculoskeletal:     Left hip: She exhibits decreased range of motion, decreased strength, tenderness and bony tenderness.  Neurological: She is alert and oriented to person, place, and time.  Skin: Skin is warm and dry.  Psychiatric: She has a normal mood and affect.    Vital signs in last 24 hours: Temp:  [99.7 F (37.6 C)] 99.7 F (37.6 C) (10/09 0620) Pulse Rate:  [95] 95 (10/09 0620) Resp:  [18] 18 (10/09 0620) BP: (142)/(80) 142/80 (10/09 0620) SpO2:  [98 %] 98 % (10/09 0620) Weight:  [99.4 kg] 99.4 kg (10/09 0545)  Labs:   Estimated body mass index is 36.45 kg/m as calculated from the following:   Height as of this encounter: 5\' 5"  (1.651 m).   Weight as of this encounter: 99.4 kg.   Imaging Review Plain radiographs demonstrate severe degenerative joint disease of the left hip(s). The bone quality appears to be good for age and reported activity level.      Assessment/Plan:  End stage arthritis, left hip(s)  The patient history, physical examination, clinical judgement of the provider and imaging studies are consistent with end stage degenerative joint disease of the left hip(s) and total hip arthroplasty is deemed medically necessary. The treatment options including medical management, injection therapy, arthroscopy and arthroplasty were discussed at length. The risks and benefits of total hip arthroplasty were presented and reviewed. The risks due to aseptic loosening, infection, stiffness,  dislocation/subluxation,  thromboembolic complications and other imponderables were discussed.  The patient acknowledged the explanation, agreed to proceed with the plan and consent was signed. Patient is being admitted for inpatient treatment for surgery, pain control, PT, OT, prophylactic antibiotics, VTE prophylaxis, progressive ambulation and ADL's and discharge planning.The patient is planning to be discharged home with home health services

## 2019-07-01 NOTE — Anesthesia Procedure Notes (Signed)
Spinal  Start time: 07/01/2019 7:28 AM End time: 07/01/2019 7:30 AM Staffing Anesthesiologist: Effie Berkshire, MD Performed: anesthesiologist  Preanesthetic Checklist Completed: patient identified, site marked, surgical consent, pre-op evaluation, timeout performed, IV checked, risks and benefits discussed and monitors and equipment checked Spinal Block Patient position: sitting Prep: site prepped and draped and DuraPrep Location: L3-4 Injection technique: single-shot Needle Needle type: Pencan  Needle gauge: 24 G Needle length: 10 cm Needle insertion depth: 10 cm Additional Notes Patient tolerated well. No immediate complications.

## 2019-07-01 NOTE — Anesthesia Postprocedure Evaluation (Signed)
Anesthesia Post Note  Patient: Cynthia Li  Procedure(s) Performed: LEFT TOTAL HIP ARTHROPLASTY ANTERIOR APPROACH (Left Hip)     Patient location during evaluation: PACU Anesthesia Type: Spinal Level of consciousness: oriented and awake and alert Pain management: pain level controlled Vital Signs Assessment: post-procedure vital signs reviewed and stable Respiratory status: spontaneous breathing, respiratory function stable and patient connected to nasal cannula oxygen Cardiovascular status: blood pressure returned to baseline and stable Postop Assessment: no headache, no backache and no apparent nausea or vomiting Anesthetic complications: no    Last Vitals:  Vitals:   07/01/19 1000 07/01/19 1014  BP: 137/88 128/72  Pulse: 67 66  Resp: (!) 22 18  Temp: 36.4 C 36.7 C  SpO2: 100% 100%    Last Pain:  Vitals:   07/01/19 1014  TempSrc: Oral  PainSc:                  Effie Berkshire

## 2019-07-01 NOTE — Brief Op Note (Signed)
07/01/2019  8:44 AM  PATIENT:  Cynthia Li  63 y.o. female  PRE-OPERATIVE DIAGNOSIS:  left hip degenerative joint disease  POST-OPERATIVE DIAGNOSIS:  left hip degenerative joint disease  PROCEDURE:  Procedure(s): LEFT TOTAL HIP ARTHROPLASTY ANTERIOR APPROACH (Left)  SURGEON:  Surgeon(s) and Role:    Mcarthur Rossetti, MD - Primary  PHYSICIAN ASSISTANT: Benita Stabile, PA-C  ANESTHESIA:   spinal  EBL:  200 mL   COUNTS:  YES  DICTATION: .Other Dictation: Dictation Number [id number]  PLAN OF CARE: Admit to inpatient   PATIENT DISPOSITION:  PACU - hemodynamically stable.   Delay start of Pharmacological VTE agent (>24hrs) due to surgical blood loss or risk of bleeding: no

## 2019-07-01 NOTE — Op Note (Signed)
NAME: CHANCIE, LAMPERT MEDICAL RECORD [id number] ACCOUNT [id number] DATE OF BIRTH:06/09/1956 FACILITY: [facility] LOCATION: [facility] PHYSICIAN:Carmichael Burdette Aretha Parrot, MD  OPERATIVE REPORT  DATE OF PROCEDURE:  07/01/2019  PREOPERATIVE DIAGNOSIS:  Debilitating arthritis and degenerative joint disease, end-stage left hip.  POSTOPERATIVE DIAGNOSIS:  Debilitating arthritis and degenerative joint disease, end-stage left hip.  PROCEDURE:  Left total hip arthroplasty through direct anterior approach.  IMPLANTS:  DePuy Sector Gription acetabular component size 52, size 36+0 neutral polyethylene liner, size 4 ACTIS femoral component with standard offset, size 36+5 metal hip ball.  SURGEON:  Vanita Panda. Magnus Ivan, MD  ASSISTANT:  Richardean Canal, PA-C  ANESTHESIA:  Spinal.  ANTIBIOTICS:  900 mg IV clindamycin.  ESTIMATED BLOOD LOSS:  200 mL.  COMPLICATIONS:  None.  INDICATIONS:  The patient is a very pleasant and active 63 year old female with debilitating arthritis involving actually both her hips.  Both of them are end-stage, and she has severe deformities of both hips.  She will eventually need bilateral hip  replacements, but due to her significant deformity and her weight, we would like to proceed with just her left, most painful hip first today.  We had a long and thorough discussion about surgery.  We discussed the risks and benefits of surgery as well as  nonoperative and operative modalities.  She has exhausted all conservative treatment measures.  Her pain is daily and it is detrimentally affecting her mobility, her quality of life, and activities of daily living, to the point she does wish to proceed  with a total hip arthroplasty on the left side today.  We talked about the risk of acute blood loss anemia, nerve and vessel injury, fracture, infection, DVT, dislocation, implant failure.  We talked about our goals being decreased pain, improved  mobility, and overall improved  quality of life.  DESCRIPTION OF PROCEDURE:  After informed consent was obtained and appropriate left hip was marked, she was brought to the operating room and sat up on a stretcher where spinal anesthesia was obtained.  She was then laid in supine position on a  stretcher.  I was able to assess her leg lengths and found that her left side is actually longer than the right due to the significant deformity of both hips.  Traction boots were placed on both her feet.  A Foley catheter was placed, and then she was  placed supine on the Hana fracture table with the perineal post in place and both legs in in-line skeletal traction device and no traction applied.  Her left operative hip was prepped and draped with DuraPrep and sterile drapes.  A timeout was called,  and she was identified as correct patient, correct left hip.  We then made an incision just inferior and posterior to the anterior superior iliac spine and carried this obliquely down the leg.  We dissected down the tensor fascia lata muscle.  Tensor  fascia was then divided longitudinally to proceed with a direct anterior approach to the hip.  We identified and cauterized circumflex vessels.  I then identified the hip capsule, opened up the hip capsule in an L-type format, finding a large joint  effusion and significant left hip deformity with periarticular osteophytes.  We placed Cobra retractors around the medial and lateral femoral neck and then made our femoral neck cut with an oscillating saw and completed this with an osteotome.  We placed  a corkscrew guide in the femoral head and removed the femoral head in its entirety and found a wide  area flattened and devoid of cartilage.  We then placed a bent Hohmann over the medial acetabular rim and began reaming from a size 44 reamer in stepwise  increments up to a size 51 with all reamers under direct visualization, the last reamer under direct fluoroscopy so we could obtain our depth of reaming, our  inclination, and anteversion.  We then placed the real DePuy Sector Gription acetabular  component size 52 and a 36+0 neutral polyethylene liner for that size acetabular component.  Attention was then turned to the femur.  With the leg externally rotated to 120 degrees, extended and adducted, we placed a Mueller retractor medially and a  Hohmann retractor behind the greater trochanter.  We released the lateral joint capsule and used a box-cutting osteotome to enter the femoral canal and a rongeur to lateralize.  We then began broaching using the ACTIS broaching system from a size 0 up to  a size 4.  With the size 4 in place, we trialed a standard offset femoral neck, and we went with a 36-2 hip ball due to her significant leg length discrepancy and it being long on that side and tight.  We reduced this in the acetabulum and definitely  felt like we needed to go up 1 ball size on her leg length.  We dislocated the hip and removed the trial components.  We placed the real ACTIS size 4 with standard offset, and we went with a 36+1.5 ceramic hip ball.  We reduced this in the acetabulum,  and after examining her with the real components in place, I felt like she needed some more tightening and leg length.  We dislocated the hip and removed that ceramic 36+1.5 hip ball.  We went with a metal 36+5 hip ball, reduced this in the acetabulum,  and I was much more pleased with her stability, range of motion, offset, and leg length.  We assessed this mechanically and radiographically.  We then irrigated the soft tissue with normal saline solution using pulsatile lavage.  We closed the joint  capsule with interrupted #1 Ethibond suture, followed by #1 Vicryl to close the tensor fascia, 0 Vicryl was used to close the deep tissue, 2-0 Vicryl was used to close the subcutaneous tissue, and interrupted staples were placed on the skin.  Xeroform  and an Aquacel dressing were applied.  She was taken off the Hana table and taken  to recovery room in stable condition.  All final counts were correct.  There were no complications noted.  Note Benita Stabile, PA-C, assisted the entire case.  His assistance  was crucial for facilitating all aspects of this case.  LN/NUANCE  D:07/01/2019 T:07/01/2019 JOB:[id number]

## 2019-07-01 NOTE — Transfer of Care (Signed)
Immediate Anesthesia Transfer of Care Note  Patient: Cynthia Li  Procedure(s) Performed: LEFT TOTAL HIP ARTHROPLASTY ANTERIOR APPROACH (Left Hip)  Patient Location: PACU  Anesthesia Type:MAC and Spinal  Level of Consciousness: awake, oriented, patient cooperative and responds to stimulation  Airway & Oxygen Therapy: Patient Spontanous Breathing and Patient connected to face mask oxygen  Post-op Assessment: Report given to RN and Post -op Vital signs reviewed and stable  Post vital signs: Reviewed and stable  Last Vitals:  Vitals Value Taken Time  BP 123/70 07/01/19 0905  Temp 36.4 C 07/01/19 0905  Pulse 85 07/01/19 0906  Resp 22 07/01/19 0906  SpO2 100 % 07/01/19 0906  Vitals shown include unvalidated device data.  Last Pain:  Vitals:   07/01/19 0620  TempSrc: Oral  PainSc: 0-No pain         Complications: No apparent anesthesia complications

## 2019-07-01 NOTE — Evaluation (Signed)
Physical Therapy Evaluation Patient Details Name: Cynthia Li MRN: [id number] DOB: 08-08-1956 Today's Date: 07/01/2019   History of Present Illness  Patient is 63 y.o. female s/p Lt THA anterior approach on 07/01/19 with PMH significant for GERD, PA, IBS, and prior back surgery and knee surgery.    Clinical Impression  [name] is a 63 y.o. female POD 0 s/p Lt THA ant approach. Patient reports modified independence with SPC and occasionally rollator for mobility at baseline. Patient is now limited by functional impairments (see PT problem list below) and requires min assist for transfers and gait with RW. Patient was able to ambulate ~60 feet with RW and cues for safe use intermittently. Patient instructed in exercise to facilitate ROM and circulation to manage edema. Patient will benefit from continued skilled PT interventions to address impairments and progress towards PLOF. Acute PT will follow to progress mobility and stair training in preparation for safe discharge home.     Follow Up Recommendations Follow surgeon's recommendation for DC plan and follow-up therapies    Equipment Recommendations  Rolling walker with 5" wheels    Recommendations for Other Services       Precautions / Restrictions Precautions Precautions: Fall Restrictions Weight Bearing Restrictions: No      Mobility  Bed Mobility Overal bed mobility: Needs Assistance Bed Mobility: Supine to Sit     Supine to sit: Min assist;HOB elevated     General bed mobility comments: pt using bed rails, min assist for LE mobiltiy and to scoot to EOB  Transfers Overall transfer level: Needs assistance Equipment used: Rolling walker (2 wheeled) Transfers: Sit to/from Stand Sit to Stand: Min guard         General transfer comment: cues for safe hand placement and technique with RW, pt able to initiate power up without assist  Ambulation/Gait Ambulation/Gait assistance: Min assist Gait Distance  (Feet): 60 Feet Assistive device: Rolling walker (2 wheeled) Gait Pattern/deviations: Step-through pattern;Decreased stride length Gait velocity: decreased   General Gait Details: ceus for sfae hand placement and safe use of RW to maintain safe proximity to walker, no overt LOB during gait  Stairs            Wheelchair Mobility    Modified Rankin (Stroke Patients Only)       Balance Overall balance assessment: Needs assistance Sitting-balance support: No upper extremity supported;Feet supported Sitting balance-Leahy Scale: Good     Standing balance support: During functional activity;Bilateral upper extremity supported Standing balance-Leahy Scale: Fair              Pertinent Vitals/Pain Pain Assessment: 0-10 Pain Score: 4  Pain Location: Lt hip Pain Descriptors / Indicators: Aching;Sore;Tightness Pain Intervention(s): Limited activity within patient's tolerance;Ice applied;Monitored during session;Repositioned    Home Living Family/patient expects to be discharged to:: Private residence Living Arrangements: Non-relatives/Friends(pt will stay at her friends initially prior to returning to her home) Available Help at Discharge: Friend(s);Available 24 hours/day Type of Home: House Home Access: Stairs to enter Entrance Stairs-Rails: None Entrance Stairs-Number of Steps: 1 (pt's house and friends house both have 1 step) Home Layout: One level Home Equipment: Walker - 4 wheels;Bedside commode;Cane - single point Additional Comments: pt lives in a "tiny home" and when she returns there she may not be able to fit a RW through the doorway of her bathroom    Prior Function Level of Independence: Independent with assistive device(s)         Comments: pt using SPC and  rollator prior to surgery     Hand Dominance   Dominant Hand: Right    Extremity/Trunk Assessment   Upper Extremity Assessment Upper Extremity Assessment: Overall WFL for tasks assessed     Lower Extremity Assessment Lower Extremity Assessment: LLE deficits/detail LLE Deficits / Details: pt with good quad activation and heel slide, at least 3/5 throughout or greater LLE Sensation: WNL LLE Coordination: WNL    Cervical / Trunk Assessment Cervical / Trunk Assessment: Normal  Communication   Communication: No difficulties  Cognition Arousal/Alertness: Awake/alert Behavior During Therapy: WFL for tasks assessed/performed Overall Cognitive Status: Within Functional Limits for tasks assessed                 General Comments      Exercises Total Joint Exercises Ankle Circles/Pumps: AROM;Seated;Both;15 reps Quad Sets: AROM;10 reps;Seated;Supine;Left Heel Slides: AROM;10 reps;Seated;Left   Assessment/Plan    PT Assessment Patient needs continued PT services  PT Problem List Decreased strength;Decreased balance;Decreased mobility;Decreased range of motion;Decreased activity tolerance;Decreased coordination;Decreased knowledge of use of DME       PT Treatment Interventions DME instruction;Functional mobility training;Balance training;Patient/family education;Modalities;Gait training;Therapeutic activities;Therapeutic exercise;Stair training    PT Goals (Current goals can be found in the Care Plan section)  Acute Rehab PT Goals Patient Stated Goal: to return to her tiny house and walk without pain PT Goal Formulation: With patient Time For Goal Achievement: 07/08/19 Potential to Achieve Goals: Good    Frequency 7X/week    AM-PAC PT "6 Clicks" Mobility  Outcome Measure Help needed turning from your back to your side while in a flat bed without using bedrails?: A Little Help needed moving from lying on your back to sitting on the side of a flat bed without using bedrails?: A Little Help needed moving to and from a bed to a chair (including a wheelchair)?: A Little Help needed standing up from a chair using your arms (e.g., wheelchair or bedside chair)?: A  Little Help needed to walk in hospital room?: A Little Help needed climbing 3-5 steps with a railing? : A Little 6 Click Score: 18    End of Session Equipment Utilized During Treatment: Gait belt Activity Tolerance: Patient tolerated treatment well Patient left: with call bell/phone within reach;in chair;with chair alarm set;with family/visitor present Nurse Communication: Mobility status PT Visit Diagnosis: Other abnormalities of gait and mobility (R26.89);Muscle weakness (generalized) (M62.81);Difficulty in walking, not elsewhere classified (R26.2)    Time: 7616-0737 PT Time Calculation (min) (ACUTE ONLY): 33 min   Charges:   PT Evaluation $PT Eval Low Complexity: 1 Low PT Treatments $Therapeutic Exercise: 8-22 mins       Valentino Saxon, PT, DPT, [facility] Physical Therapist with [facility]  07/01/2019 3:15 PM

## 2019-07-02 DIAGNOSIS — M1612 Unilateral primary osteoarthritis, left hip: Secondary | ICD-10-CM | POA: Diagnosis not present

## 2019-07-02 LAB — CBC
HCT: 32.7 % — ABNORMAL LOW (ref 36.0–46.0)
Hemoglobin: 10.6 g/dL — ABNORMAL LOW (ref 12.0–15.0)
MCH: 30.3 pg (ref 26.0–34.0)
MCHC: 32.4 g/dL (ref 30.0–36.0)
MCV: 93.4 fL (ref 80.0–100.0)
Platelets: 212 10*3/uL (ref 150–400)
RBC: 3.5 MIL/uL — ABNORMAL LOW (ref 3.87–5.11)
RDW: 12.5 % (ref 11.5–15.5)
WBC: 8.7 10*3/uL (ref 4.0–10.5)
nRBC: 0 % (ref 0.0–0.2)

## 2019-07-02 LAB — BASIC METABOLIC PANEL
Anion gap: 8 (ref 5–15)
BUN: 11 mg/dL (ref 8–23)
CO2: 26 mmol/L (ref 22–32)
Calcium: 9.2 mg/dL (ref 8.9–10.3)
Chloride: 105 mmol/L (ref 98–111)
Creatinine, Ser: 0.56 mg/dL (ref 0.44–1.00)
GFR calc Af Amer: >60 mL/min (ref >60)
GFR calc non Af Amer: >60 mL/min (ref >60)
Glucose, Bld: 124 mg/dL — ABNORMAL HIGH (ref 70–99)
Potassium: 3.7 mmol/L (ref 3.5–5.1)
Sodium: 139 mmol/L (ref 135–145)

## 2019-07-02 MED ORDER — OXYCODONE HCL 5 MG PO TABS: 5.0000 mg | ORAL_TABLET | ORAL | 0 refills | Status: DC | PRN

## 2019-07-02 MED ORDER — METHOCARBAMOL 500 MG PO TABS
500.0000 mg | ORAL_TABLET | Freq: Four times a day (QID) | ORAL | Status: DC | PRN
  Administered 2019-07-02 – 2019-07-03 (×2): 500 mg via ORAL
  Filled 2019-07-02 (×2): qty 1

## 2019-07-02 MED ORDER — ASPIRIN 81 MG PO CHEW: 81.0000 mg | CHEWABLE_TABLET | Freq: Two times a day (BID) | ORAL | 0 refills | Status: DC

## 2019-07-02 NOTE — Progress Notes (Signed)
Physical Therapy Treatment Patient Details Name: Cynthia Li MRN: [id number] DOB: 1956-03-28 Today's Date: 07/02/2019    History of Present Illness Patient is 63 y.o. female s/p Lt THA anterior approach on 07/01/19 with PMH significant for GERD, PA, IBS, and prior back surgery and knee surgery.    PT Comments    Pt motivated and progressing steadily with mobility.  Pt hopeful for dc home tomorrow.   Follow Up Recommendations  Follow surgeon's recommendation for DC plan and follow-up therapies     Equipment Recommendations  Rolling walker with 5" wheels    Recommendations for Other Services       Precautions / Restrictions Precautions Precautions: Fall Restrictions Weight Bearing Restrictions: No    Mobility  Bed Mobility               General bed mobility comments: Pt up to chair and requests back to same  Transfers Overall transfer level: Needs assistance Equipment used: Rolling walker (2 wheeled) Transfers: Sit to/from Stand Sit to Stand: Min assist         General transfer comment: cues for safe hand placement and technique with RW, pt able to initiate power up without assist  Ambulation/Gait Ambulation/Gait assistance: Min assist;Min guard Gait Distance (Feet): 90 Feet Assistive device: Rolling walker (2 wheeled) Gait Pattern/deviations: Step-through pattern;Decreased stride length Gait velocity: decreased   General Gait Details: cues for posture, position from RW and initial sequence   Stairs             Wheelchair Mobility    Modified Rankin (Stroke Patients Only)       Balance Overall balance assessment: Needs assistance Sitting-balance support: No upper extremity supported;Feet supported Sitting balance-Leahy Scale: Good     Standing balance support: During functional activity;Bilateral upper extremity supported Standing balance-Leahy Scale: Fair                              Cognition Arousal/Alertness:  Awake/alert Behavior During Therapy: WFL for tasks assessed/performed Overall Cognitive Status: Within Functional Limits for tasks assessed                                        Exercises Total Joint Exercises Ankle Circles/Pumps: AROM;Seated;Both;15 reps Quad Sets: AROM;10 reps;Seated;Supine;Left Heel Slides: AROM;Left;20 reps;Supine Hip ABduction/ADduction: AAROM;Left;15 reps;Supine    General Comments        Pertinent Vitals/Pain Pain Assessment: 0-10 Pain Score: 5  Pain Location: Lt hip Pain Descriptors / Indicators: Aching;Sore;Tightness Pain Intervention(s): Limited activity within patient's tolerance;Monitored during session;Premedicated before session;Ice applied    Home Living                      Prior Function            PT Goals (current goals can now be found in the care plan section) Acute Rehab PT Goals Patient Stated Goal: to return to her tiny house and walk without pain PT Goal Formulation: With patient Time For Goal Achievement: 07/08/19 Potential to Achieve Goals: Good Progress towards PT goals: Progressing toward goals    Frequency    7X/week      PT Plan Current plan remains appropriate    Co-evaluation              AM-PAC PT "6 Clicks" Mobility   Outcome Measure  Help  needed turning from your back to your side while in a flat bed without using bedrails?: A Little Help needed moving from lying on your back to sitting on the side of a flat bed without using bedrails?: A Little Help needed moving to and from a bed to a chair (including a wheelchair)?: A Little Help needed standing up from a chair using your arms (e.g., wheelchair or bedside chair)?: A Little Help needed to walk in hospital room?: A Little Help needed climbing 3-5 steps with a railing? : A Little 6 Click Score: 18    End of Session Equipment Utilized During Treatment: Gait belt Activity Tolerance: Patient tolerated treatment  well Patient left: with call bell/phone within reach;in chair;with chair alarm set Nurse Communication: Mobility status PT Visit Diagnosis: Other abnormalities of gait and mobility (R26.89);Muscle weakness (generalized) (M62.81);Difficulty in walking, not elsewhere classified (R26.2)     Time: 9470-9628 PT Time Calculation (min) (ACUTE ONLY): 28 min  Charges:  $Gait Training: 8-22 mins $Therapeutic Exercise: 8-22 mins                     [place] Pager [phone] Office [phone]    Cynthia Li 07/02/2019, 11:58 AM

## 2019-07-02 NOTE — TOC Transition Note (Signed)
Transition of Care [facility]) - CM/SW Discharge Note   Patient Details  Name: Cynthia Li MRN: [id number] Date of Birth: 10-26-1955  Transition of Care [facility]) CM/SW Contact:  Dessa Phi, RN Phone Number: 07/02/2019, 10:15 AM   Clinical Narrative:  D/c today home w/[facility] rep [place]wyn Ren aware. Rw,tub bench through [employer] rep [place] aware. No further CM needs.     Final next level of care: [place] Barriers to Discharge: No Barriers Identified   Patient Goals and CMS Choice        Discharge Placement                       Discharge Plan and Services   Discharge Planning Services: CM Consult            DME Arranged: Tub bench, Walker rolling DME Agency: [employer] Date DME Agency Contacted: 07/02/19 Time DME Agency Contacted: [phone] Representative spoke with at DME Agency: [place]: PT [place]: [facility] [employer] Date [place]: 07/01/19 Time [place]: [place] Representative spoke with at [place]: [place] ([place]) Interventions     Readmission Risk Interventions No flowsheet data found.

## 2019-07-02 NOTE — Progress Notes (Signed)
Physical Therapy Treatment Patient Details Name: Cynthia Li MRN: [id number] DOB: 1956/07/09 Today's Date: 07/02/2019    History of Present Illness Patient is 63 y.o. female s/p Lt THA anterior approach on 07/01/19 with PMH significant for GERD, PA, IBS, and prior back surgery and knee surgery.    PT Comments    Pt progressing steadily with mobility and hopeful for dc home tomorrow   Follow Up Recommendations  Follow surgeon's recommendation for DC plan and follow-up therapies     Equipment Recommendations  Rolling walker with 5" wheels    Recommendations for Other Services       Precautions / Restrictions Precautions Precautions: Fall Restrictions Weight Bearing Restrictions: No    Mobility  Bed Mobility Overal bed mobility: Needs Assistance Bed Mobility: Sit to Supine       Sit to supine: Min assist   General bed mobility comments: cues for sequence and use of R LE to self assist  Transfers Overall transfer level: Needs assistance Equipment used: Rolling walker (2 wheeled) Transfers: Sit to/from Stand Sit to Stand: Min guard         General transfer comment: cues for safe hand placement and technique with RW, pt able to initiate power up without assist  Ambulation/Gait Ambulation/Gait assistance: Min guard Gait Distance (Feet): 175 Feet Assistive device: Rolling walker (2 wheeled) Gait Pattern/deviations: Step-through pattern;Decreased stride length Gait velocity: decreased   General Gait Details: cues for posture, position from RW and initial sequence   Stairs             Wheelchair Mobility    Modified Rankin (Stroke Patients Only)       Balance Overall balance assessment: Needs assistance Sitting-balance support: No upper extremity supported;Feet supported Sitting balance-Leahy Scale: Good     Standing balance support: During functional activity;Bilateral upper extremity supported Standing balance-Leahy Scale: Fair                              Cognition Arousal/Alertness: Awake/alert Behavior During Therapy: WFL for tasks assessed/performed Overall Cognitive Status: Within Functional Limits for tasks assessed                                        Exercises Total Joint Exercises Ankle Circles/Pumps: AROM;Seated;Both;15 reps Quad Sets: AROM;10 reps;Seated;Supine;Left Heel Slides: AROM;Left;20 reps;Supine Hip ABduction/ADduction: AAROM;Left;15 reps;Supine    General Comments        Pertinent Vitals/Pain Pain Assessment: 0-10 Pain Score: 5  Pain Location: Lt hip Pain Descriptors / Indicators: Aching;Sore;Tightness Pain Intervention(s): Limited activity within patient's tolerance;Monitored during session;Premedicated before session;Ice applied    Home Living                      Prior Function            PT Goals (current goals can now be found in the care plan section) Acute Rehab PT Goals Patient Stated Goal: to return to her tiny house and walk without pain PT Goal Formulation: With patient Time For Goal Achievement: 07/08/19 Potential to Achieve Goals: Good Progress towards PT goals: Progressing toward goals    Frequency    7X/week      PT Plan Current plan remains appropriate    Co-evaluation              AM-PAC PT "6 Clicks" Mobility  Outcome Measure  Help needed turning from your back to your side while in a flat bed without using bedrails?: A Little Help needed moving from lying on your back to sitting on the side of a flat bed without using bedrails?: A Little Help needed moving to and from a bed to a chair (including a wheelchair)?: A Little Help needed standing up from a chair using your arms (e.g., wheelchair or bedside chair)?: A Little Help needed to walk in hospital room?: A Little Help needed climbing 3-5 steps with a railing? : A Little 6 Click Score: 18    End of Session Equipment Utilized During Treatment: Gait  belt Activity Tolerance: Patient tolerated treatment well Patient left: with call bell/phone within reach;in chair;with chair alarm set Nurse Communication: Mobility status PT Visit Diagnosis: Other abnormalities of gait and mobility (R26.89);Muscle weakness (generalized) (M62.81);Difficulty in walking, not elsewhere classified (R26.2)     Time: 8119-1478 PT Time Calculation (min) (ACUTE ONLY): 42 min  Charges:  $Gait Training: 8-22 mins $Therapeutic Exercise: 8-22 mins $Therapeutic Activity: 8-22 mins                     Mauro Kaufmann PT Acute Rehabilitation Services Pager [phone] Office [phone]    Marlow Berenguer 07/02/2019, 5:03 PM

## 2019-07-02 NOTE — Discharge Instructions (Signed)

## 2019-07-02 NOTE — Evaluation (Signed)
Occupational Therapy Evaluation and Discharge Patient Details Name: Cynthia Li MRN: [id number] DOB: March 24, 1956 Today's Date: 07/02/2019    History of Present Illness Patient is 63 y.o. female s/p Lt THA anterior approach on 07/01/19 with PMH significant for GERD, PA, IBS, and prior back surgery and knee surgery.   Clinical Impression   PTA Pt was mod I in home environment using DME and AE for mobility and ADL. Today Pt is min guard with RW for transfers/mobility in room and mod A for LB ADL. Focused on tub transfer (tub bench already delivered in room) and use of sock donner (Pt already has grabber/reacher and long handle shoe horn from previous back sx). She will also have assist from "Dawn". Education also provided on rear peri care including compensatory strategies and AE. Pt verbalized thanks and feels much more confident and better after education prior to dc. OT to sign off at this time as education complete and Pt has DME needs met.    Follow Up Recommendations  No OT follow up;Supervision/Assistance - 24 hour(initially)    Equipment Recommendations  Tub/shower bench;Other (comment)(already in room, provided sock donner)    Recommendations for Other Services       Precautions / Restrictions Precautions Precautions: Fall Restrictions Weight Bearing Restrictions: No      Mobility Bed Mobility Overal bed mobility: Needs Assistance Bed Mobility: Sit to Supine       Sit to supine: Min assist   General bed mobility comments: cues for sequence and use of R LE to self assist  Transfers Overall transfer level: Needs assistance Equipment used: Rolling walker (2 wheeled) Transfers: Sit to/from Stand Sit to Stand: Min guard         General transfer comment: cues for safe hand placement and technique with RW, pt able to initiate power up without assist    Balance Overall balance assessment: Needs assistance Sitting-balance support: No upper extremity supported;Feet  supported Sitting balance-Leahy Scale: Good     Standing balance support: During functional activity;Bilateral upper extremity supported Standing balance-Leahy Scale: Fair                             ADL either performed or assessed with clinical judgement   ADL Overall ADL's : Needs assistance/impaired Eating/Feeding: Independent   Grooming: Wash/dry hands;Wash/dry face;Min guard;Standing Grooming Details (indicate cue type and reason): sink level Upper Body Bathing: Modified independent;Sitting   Lower Body Bathing: Moderate assistance;Sitting/lateral leans Lower Body Bathing Details (indicate cue type and reason): educated in long handle sponge Upper Body Dressing : Independent   Lower Body Dressing: Moderate assistance;Sit to/from stand;With adaptive equipment Lower Body Dressing Details (indicate cue type and reason): provided with sock donner, Pt uses grabbers typically "honey, I haven't been able to get to my feet in years" Toilet Transfer: Min guard;Ambulation;RW   Toileting- Clothing Manipulation and Hygiene: Supervision/safety;Sitting/lateral lean Toileting - Clothing Manipulation Details (indicate cue type and reason): educated in toilet aide options for rear peri care including showing photos and demonstrating how to wrap toilet paper for easy discarding Tub/ Shower Transfer: Tub transfer;Tub bench;Min guard Tub/Shower Transfer Details (indicate cue type and reason): demonstrated and also showed video Functional mobility during ADLs: Min guard;Rolling walker       Vision Baseline Vision/History: Wears glasses Wears Glasses: Reading only Patient Visual Report: No change from baseline       Perception     Praxis      Pertinent  Vitals/Pain Pain Assessment: 0-10 Pain Score: 5  Pain Location: Lt hip Pain Descriptors / Indicators: Aching;Sore;Tightness Pain Intervention(s): Limited activity within patient's tolerance;Monitored during  session;Repositioned;RN gave pain meds during session     Hand Dominance Right   Extremity/Trunk Assessment Upper Extremity Assessment Upper Extremity Assessment: Overall WFL for tasks assessed   Lower Extremity Assessment Lower Extremity Assessment: LLE deficits/detail LLE Deficits / Details: anticipated deficits post-op LLE Sensation: WNL LLE Coordination: WNL   Cervical / Trunk Assessment Cervical / Trunk Assessment: Normal   Communication Communication Communication: No difficulties   Cognition Arousal/Alertness: Awake/alert Behavior During Therapy: WFL for tasks assessed/performed Overall Cognitive Status: Within Functional Limits for tasks assessed                                     General Comments       Exercises Exercises: Total Joint Total Joint Exercises Ankle Circles/Pumps: AROM;Seated;Both;15 reps Quad Sets: AROM;10 reps;Seated;Supine;Left Heel Slides: AROM;Left;20 reps;Supine Hip ABduction/ADduction: AAROM;Left;15 reps;Supine   Shoulder Instructions      Home Living Family/patient expects to be discharged to:: Private residence Living Arrangements: Non-relatives/Friends Available Help at Discharge: Friend(s);Available 24 hours/day Type of Home: House Home Access: Stairs to enter [employer] of Steps: 1 (pt's house and friends house both have 1 step) Entrance Stairs-Rails: None Home Layout: One level     Bathroom Shower/Tub: Teacher, early years/pre: Standard Bathroom Accessibility: Yes How Accessible: Accessible via walker Home Equipment: [place] - 4 wheels;Bedside commode;Cane - single point   Additional Comments: pt lives in a "tiny home" and when she returns there she may not be able to fit a RW through the doorway of her bathroom      Prior Functioning/Environment Level of Independence: Independent with assistive device(s)        Comments: pt using SPC and rollator prior to surgery        OT  Problem List: Decreased activity tolerance;Impaired balance (sitting and/or standing);Decreased knowledge of use of DME or AE;Pain      OT Treatment/Interventions:      OT Goals(Current goals can be found in the care plan section) Acute Rehab OT Goals Patient Stated Goal: to return to her tiny house and walk without pain OT Goal Formulation: With patient Time For Goal Achievement: 07/16/19 Potential to Achieve Goals: Good  OT Frequency:     Barriers to D/C:            Co-evaluation              AM-PAC OT "6 Clicks" Daily Activity     Outcome Measure Help from another person eating meals?: None Help from another person taking care of personal grooming?: A Little Help from another person toileting, which includes using toliet, bedpan, or urinal?: A Little Help from another person bathing (including washing, rinsing, drying)?: A Lot Help from another person to put on and taking off regular upper body clothing?: None Help from another person to put on and taking off regular lower body clothing?: A Lot 6 Click Score: 18   End of Session Equipment Utilized During Treatment: Rolling walker Nurse Communication: Mobility status  Activity Tolerance: Patient tolerated treatment well Patient left: in bed;with call bell/phone within reach;with bed alarm set;with family/visitor present  OT Visit Diagnosis: Other abnormalities of gait and mobility (R26.89);Pain Pain - Right/Left: Left Pain - part of body: Hip  Time: 0981-1914 OT Time Calculation (min): 31 min Charges:  OT General Charges $OT Visit: 1 Visit OT Evaluation $OT Eval Moderate Complexity: 1 Mod OT Treatments $Self Care/Home Management : 8-22 mins  Hulda Humphrey OTR/L Acute Rehabilitation Services Pager: [phone] Office: [place] 07/02/2019, 6:16 PM

## 2019-07-02 NOTE — Progress Notes (Signed)
  Subjective: Patient stable.  Rates pain as 2 out of 10.  Has been walking in the room this morning.  Plans to go back with son and daughter-in-law.   Objective: Vital signs in last 24 hours: Temp:  [97.5 F (36.4 C)-98.6 F (37 C)] 98.6 F (37 C) (10/10 0616) Pulse Rate:  [49-79] 73 (10/10 0616) Resp:  [13-22] 16 (10/10 0616) BP: (107-137)/(56-88) 121/71 (10/10 0616) SpO2:  [97 %-100 %] 97 % (10/10 0616)  Intake/Output from previous day: 10/09 0701 - 10/10 0700 In: 4334.2 [P.O.:1620; I.V.:2414.2; IV Piggyback:300] Out: 3020 [Urine:2820; Blood:200] Intake/Output this shift: Total I/O In: 240 [P.O.:240] Out: -   Exam:  Dorsiflexion/Plantar flexion intact  Labs: Recent Labs    07/02/19 0351  HGB 10.6*   Recent Labs    07/02/19 0351  WBC 8.7  RBC 3.50*  HCT 32.7*  PLT 212   Recent Labs    07/02/19 0351  NA 139  K 3.7  CL 105  CO2 26  BUN 11  CREATININE 0.56  GLUCOSE 124*  CALCIUM 9.2   No results for input(s): LABPT, INR in the last 72 hours.  Assessment/Plan: Plan at this time is probable discharge tomorrow after several more therapy visits today.  She has not done stairs yet.  Overall she is doing very well   Anderson Malta 07/02/2019, 9:36 AM

## 2019-07-03 DIAGNOSIS — M1612 Unilateral primary osteoarthritis, left hip: Secondary | ICD-10-CM | POA: Diagnosis not present

## 2019-07-03 MED ORDER — METHOCARBAMOL 500 MG PO TABS: 500.0000 mg | ORAL_TABLET | Freq: Three times a day (TID) | ORAL | 0 refills | Status: DC | PRN

## 2019-07-03 NOTE — Progress Notes (Signed)
  Subjective: Cynthia Li is a 63 y.o. female s/p left THA.  They are POD2.  Pt's pain is controlled.  Pt has ambulated with some difficulty.   She did well in PT yesterday and is hopeful for discharge.    Objective: Vital signs in last 24 hours: Temp:  [98 F (36.7 C)-98.9 F (37.2 C)] 98 F (36.7 C) (10/11 0643) Pulse Rate:  [78-90] 89 (10/11 0643) Resp:  [16] 16 (10/11 0643) BP: (103-114)/(59-67) 103/59 (10/11 0643) SpO2:  [94 %-100 %] 94 % (10/11 0643)  Intake/Output from previous day: 10/10 0701 - 10/11 0700 In: 780 [P.O.:780] Out: 701 [Urine:701] Intake/Output this shift: Total I/O In: 60 [P.O.:60] Out: 1 [Urine:1]  Exam:  No gross blood or drainage overlying the dressing 2+ DP pulse Sensation intact distally in the left foot Able to dorsiflex and plantarflex the left foot   Labs: Recent Labs    07/02/19 0351  HGB 10.6*   Recent Labs    07/02/19 0351  WBC 8.7  RBC 3.50*  HCT 32.7*  PLT 212   Recent Labs    07/02/19 0351  NA 139  K 3.7  CL 105  CO2 26  BUN 11  CREATININE 0.56  GLUCOSE 124*  CALCIUM 9.2   No results for input(s): LABPT, INR in the last 72 hours.  Assessment/Plan: Pt is POD2 s/p left THA.    -Plan to discharge to home today pending patient's pain and PT eval  -WBAT with a walker  -Okay to shower, dressing is waterproof.  Cautioned patient against soaking dressing in bath/pool/body of water     Donella Stade 07/03/2019, 6:49 AM

## 2019-07-03 NOTE — Progress Notes (Signed)
Physical Therapy Treatment Patient Details Name: Cynthia Li MRN: [id number] DOB: 05-13-1956 Today's Date: 07/03/2019    History of Present Illness Patient is 63 y.o. female s/p Lt THA anterior approach on 07/01/19 with PMH significant for GERD, PA, IBS, and prior back surgery and knee surgery.    PT Comments    Pt progressing well with mobility and eager for dc home.  Pt reviewed car transfers, stairs, and home therex with written instruction provided.   Follow Up Recommendations  Follow surgeon's recommendation for DC plan and follow-up therapies     Equipment Recommendations  Rolling walker with 5" wheels    Recommendations for Other Services       Precautions / Restrictions Precautions Precautions: Fall Restrictions Weight Bearing Restrictions: No    Mobility  Bed Mobility Overal bed mobility: Needs Assistance Bed Mobility: Sit to Supine;Supine to Sit     Supine to sit: Min assist Sit to supine: Min assist   General bed mobility comments: cues for sequence and use of R LE to self assist  Transfers Overall transfer level: Needs assistance Equipment used: Rolling walker (2 wheeled) Transfers: Sit to/from Stand Sit to Stand: Supervision         General transfer comment: pt self-cues for safe hand placement and technique with RW, pt able to initiate power up without assist  Ambulation/Gait Ambulation/Gait assistance: Min guard;Supervision Gait Distance (Feet): 140 Feet Assistive device: Rolling walker (2 wheeled) Gait Pattern/deviations: Step-through pattern;Decreased stride length Gait velocity: decreased   General Gait Details: cues for posture, position from RW and initial sequence   Stairs Stairs: Yes Stairs assistance: Min guard Stair Management: No rails;Step to pattern;Forwards;With walker Number of Stairs: 2 General stair comments: single step twice with RW and cues for sequence and foot/RW placement   Wheelchair Mobility    Modified  Rankin (Stroke Patients Only)       Balance Overall balance assessment: Needs assistance Sitting-balance support: No upper extremity supported;Feet supported Sitting balance-Leahy Scale: Good     Standing balance support: During functional activity;Bilateral upper extremity supported Standing balance-Leahy Scale: Fair                              Cognition Arousal/Alertness: Awake/alert Behavior During Therapy: WFL for tasks assessed/performed Overall Cognitive Status: Within Functional Limits for tasks assessed                                        Exercises Total Joint Exercises Ankle Circles/Pumps: AROM;Seated;Both;15 reps Quad Sets: AROM;10 reps;Seated;Supine;Left Heel Slides: AROM;Left;20 reps;Supine Hip ABduction/ADduction: AAROM;Left;15 reps;Supine Long Arc Quad: AROM;Left;10 reps;Supine    General Comments        Pertinent Vitals/Pain Pain Assessment: 0-10 Pain Score: 4  Pain Location: Lt hip Pain Descriptors / Indicators: Aching;Sore;Tightness Pain Intervention(s): Limited activity within patient's tolerance;Monitored during session;Premedicated before session;Ice applied    Home Living                      Prior Function            PT Goals (current goals can now be found in the care plan section) Acute Rehab PT Goals Patient Stated Goal: to return to her tiny house and walk without pain PT Goal Formulation: With patient Time For Goal Achievement: 07/08/19 Potential to Achieve Goals: Good Progress towards PT  goals: Progressing toward goals    Frequency    7X/week      PT Plan Current plan remains appropriate    Co-evaluation              AM-PAC PT "6 Clicks" Mobility   Outcome Measure  Help needed turning from your back to your side while in a flat bed without using bedrails?: A Little Help needed moving from lying on your back to sitting on the side of a flat bed without using bedrails?: A  Little Help needed moving to and from a bed to a chair (including a wheelchair)?: A Little Help needed standing up from a chair using your arms (e.g., wheelchair or bedside chair)?: A Little Help needed to walk in hospital room?: A Little Help needed climbing 3-5 steps with a railing? : A Little 6 Click Score: 18    End of Session Equipment Utilized During Treatment: Gait belt Activity Tolerance: Patient tolerated treatment well Patient left: with call bell/phone within reach;in chair;with chair alarm set Nurse Communication: Mobility status PT Visit Diagnosis: Other abnormalities of gait and mobility (R26.89);Muscle weakness (generalized) (M62.81);Difficulty in walking, not elsewhere classified (R26.2)     Time: 8657-8469 PT Time Calculation (min) (ACUTE ONLY): 45 min  Charges:  $Gait Training: 8-22 mins $Therapeutic Exercise: 8-22 mins $Therapeutic Activity: 8-22 mins                     Mauro Kaufmann PT Acute Rehabilitation Services Pager [phone] Office [phone]    [facility] 07/03/2019, 12:37 PM

## 2019-07-04 ENCOUNTER — Encounter (HOSPITAL_COMMUNITY): Payer: Self-pay | Admitting: Orthopaedic Surgery

## 2019-07-05 ENCOUNTER — Telehealth: Payer: Self-pay | Admitting: Orthopaedic Surgery

## 2019-07-05 NOTE — Telephone Encounter (Signed)
Received call from Gracee-(PT) with [facility] needing verbal orders for HHPT 3 Wk 2. The number to contact Gracee is [phone] °

## 2019-07-05 NOTE — Telephone Encounter (Signed)
Verbal order given  

## 2019-07-10 NOTE — Discharge Summary (Signed)
Physician Discharge Summary      Patient ID: Cynthia SimasCeleste D Li MRN: [id number] DOB/AGE: 65-18-1957 65 y.o.  Admit date: 07/01/2019 Discharge date: 07/03/2019  Admission Diagnoses:  Principal Problem:   Unilateral primary osteoarthritis, left hip Active Problems:   Status post total replacement of left hip   Discharge Diagnoses:  Same  Surgeries: Procedure(s): LEFT TOTAL HIP ARTHROPLASTY ANTERIOR APPROACH on 07/01/2019   Consultants:   Discharged Condition: Stable  Hospital Course: Cynthia Li is an 63 y.o. female who was admitted 07/01/2019 with a chief complaint of left hip pain, and found to have a diagnosis of left hip osteoarthritis.  They were brought to the operating room on 07/01/2019 and underwent the above named procedures.  Pt awoke from anesthesia without complication and was transferred to the floor. On POD1, patient's pain was well controlled and she was able to ambulate around her room.  On postop day 2, patient's pain continued to improve and she mobilized well in physical therapy, able to do stairs.  She was discharged home on postop day 2.  Pt will f/u with Dr. Magnus IvanBlackman in clinic in ~2 weeks.   Antibiotics given:  Anti-infectives (From admission, onward)   Start     Dose/Rate Route Frequency Ordered Stop   07/01/19 1330  clindamycin (CLEOCIN) IVPB 600 mg     600 mg 100 mL/hr over 30 Minutes Intravenous Every 6 hours 07/01/19 1011 07/01/19 2030   07/01/19 0600  clindamycin (CLEOCIN) IVPB 900 mg     900 mg 100 mL/hr over 30 Minutes Intravenous On call to O.R. 07/01/19 [id number] 07/01/19 0733    .  Recent vital signs:  Vitals:   07/02/19 2107 07/03/19 0643  BP: (!) 113/59 (!) 103/59  Pulse: 90 89  Resp: 16 16  Temp: 98.8 F (37.1 C) 98 F (36.7 C)  SpO2: 94% 94%    Recent laboratory studies:  Results for orders placed or performed during the hospital encounter of 07/01/19  CBC  Result Value Ref Range   WBC 8.7 4.0 - 10.5 K/uL   RBC 3.50 (L) 3.87 -  5.11 MIL/uL   Hemoglobin 10.6 (L) 12.0 - 15.0 g/dL   HCT 01.032.7 (L) 27.236.0 - 53.646.0 %   MCV 93.4 80.0 - 100.0 fL   MCH 30.3 26.0 - 34.0 pg   MCHC 32.4 30.0 - 36.0 g/dL   RDW 64.412.5 03.411.5 - 74.215.5 %   Platelets 212 150 - 400 K/uL   nRBC 0.0 0.0 - 0.2 %  Basic metabolic panel  Result Value Ref Range   Sodium 139 135 - 145 mmol/L   Potassium 3.7 3.5 - 5.1 mmol/L   Chloride 105 98 - 111 mmol/L   CO2 26 22 - 32 mmol/L   Glucose, Bld 124 (H) 70 - 99 mg/dL   BUN 11 8 - 23 mg/dL   Creatinine, Ser 5.950.56 0.44 - 1.00 mg/dL   Calcium 9.2 8.9 - 63.810.3 mg/dL   GFR calc non Af Amer >60 >60 mL/min   GFR calc Af Amer >60 >60 mL/min   Anion gap 8 5 - 15  Type and screen [facility]  Result Value Ref Range   ABO/RH(D) O POS    Antibody Screen NEG    Sample Expiration      07/04/2019,2359 Performed at [facility], [address] [place], [place] [address]   ABO/Rh  Result Value Ref Range   ABO/RH(D)      O POS Performed at  [facility], [address] [place], [place] [address]     Diagnostic Studies: Dg Pelvis Portable  Result Date: 07/01/2019 CLINICAL DATA:  Primary osteoarthritis of the left hip. Status post left total hip arthroplasty. EXAM: PORTABLE PELVIS 1-2 VIEWS COMPARISON:  Radiographs dated 05/24/2019 FINDINGS: The components of the left total hip prosthesis appear in excellent position in the AP projection. No fractures. Severe arthritis of the right hip is noted. IMPRESSION: 1. Satisfactory appearance of the left hip in the AP projection after total hip replacement. 2. Severe arthritis of the right hip. Electronically Signed   By: Francene Boyers M.D.   On: 07/01/2019 09:55   Dg C-arm 1-60 Min-no Report  Result Date: 07/01/2019 Fluoroscopy was utilized by the requesting physician.  No radiographic interpretation.   Dg Hip Operative Unilat With Pelvis Left  Result Date: 07/01/2019 CLINICAL DATA:  Left total hip replacement. EXAM:  OPERATIVE LEFT HIP (WITH PELVIS IF PERFORM2ED) 2 VIEWS TECHNIQUE: Fluoroscopic spot image(s) were submitted for interpretation post-operatively. COMPARISON:  Left hip radiographs 05/24/2019 FINDINGS: Two intraoperative fluoroscopic images are provided following left total hip arthroplasty. The prosthetic femoral and acetabular components are approximated with 1 another on these limited images, and no acute fracture is identified. Severe right hip osteoarthrosis is again noted. IMPRESSION: 1. Intraoperative images during left total hip arthroplasty. 2. Severe right hip osteoarthrosis. Electronically Signed   By: Sebastian Ache M.D.   On: 07/01/2019 11:55    Disposition:   Discharge Instructions    Call MD / Call 911   Complete by: As directed    If you experience chest pain or shortness of breath, CALL 911 and be transported to the hospital emergency room.  If you develope a fever above 101 F, pus (white drainage) or increased drainage or redness at the wound, or calf pain, call your surgeon's office.   Call MD / Call 911   Complete by: As directed    If you experience chest pain or shortness of breath, CALL 911 and be transported to the hospital emergency room.  If you develope a fever above 101 F, pus (white drainage) or increased drainage or redness at the wound, or calf pain, call your surgeon's office.   Constipation Prevention   Complete by: As directed    Drink plenty of fluids.  Prune juice may be helpful.  You may use a stool softener, such as Colace (over the counter) 100 mg twice a day.  Use MiraLax (over the counter) for constipation as needed.   Constipation Prevention   Complete by: As directed    Drink plenty of fluids.  Prune juice may be helpful.  You may use a stool softener, such as Colace (over the counter) 100 mg twice a day.  Use MiraLax (over the counter) for constipation as needed.   Diet - low sodium heart healthy   Complete by: As directed    Discharge instructions    Complete by: As directed    You may shower, dressing is waterproof.  Do not remove the dressing, we will remove it at your first post-op appointment.  Do not take a bath or soak the knee in a tub or pool.  You may weightbear as you can tolerate on the operative leg with a walker.  Continue using the CPM machine 3 times per day for one hour each time, increasing the degrees of range of motion daily (if you have a CPM machine).  Use the blue cradle boot or  a pillow under your heel to work on getting your leg straight.  Do NOT put a pillow under your knee.  You will follow-up with Dr. Ninfa Linden in the clinic in 1-2 weeks at your given appointment date.   Discharge instructions   Complete by: As directed    You may shower, dressing is waterproof.  Do not remove the dressing, we will remove it at your first post-op appointment.  Do not take a bath or soak the operative site in a tub or pool.  You may weightbear as you can tolerate on the operative leg with a walker.  Continue with exercises as directed by physical therapist.  You will follow-up with Dr. Ninfa Linden in the clinic in 1-2 weeks at your given appointment date.   Increase activity slowly as tolerated   Complete by: As directed    Increase activity slowly as tolerated   Complete by: As directed       Follow-up Information    [facility] Follow up.   Specialty: [place] Why: [facility] physical therapy Contact information: [place] [address] [phone]        [facility] Follow up.   Why: rolling walker;tub bench Contact information: [place] [address] [phone]        Mcarthur Rossetti, MD Follow up in 2 week(s).   Specialty: Orthopedic Surgery Contact information: [place] [address] [phone]            Signed: Donella Stade 07/10/2019, 2:44 PM

## 2019-07-14 ENCOUNTER — Ambulatory Visit (INDEPENDENT_AMBULATORY_CARE_PROVIDER_SITE_OTHER): Payer: Medicare Other | Admitting: Orthopaedic Surgery

## 2019-07-14 ENCOUNTER — Encounter: Payer: Self-pay | Admitting: Orthopaedic Surgery

## 2019-07-14 ENCOUNTER — Other Ambulatory Visit: Payer: Self-pay

## 2019-07-14 DIAGNOSIS — M1611 Unilateral primary osteoarthritis, right hip: Secondary | ICD-10-CM

## 2019-07-14 DIAGNOSIS — Z96642 Presence of left artificial hip joint: Secondary | ICD-10-CM

## 2019-07-14 NOTE — Progress Notes (Signed)
HPI: Cynthia Li returns today 2 weeks status post left total hip arthroplasty she is overall doing well.  She stopped taking her oxycodone last Tuesday and stopped taking Robaxin this past Monday.  She is taking aspirin twice daily.  She was on no aspirin prior to surgery.  She is asking about having her right total hip done in the near future.  She has known osteoarthritis right hip.  She now has a leg length discrepancy secondary to surgery with the left leg being longer than the right.  Review of systems: No fever chills shortness breath chest pain.  Physical exam: Left hip decreased range of motion.  Surgical incisions well approximated staples in 3 nylon sutures at the very proximal end of the incision.  Left calf supple nontender. Dorsiflexion plantarflexion of the left ankle intact.  Impression: Status post left total hip arthroplasty 07/01/2019 Leg length discrepancy Right hip osteoarthritis  Plan: Staples removed nylon sutures removed.  Steri-Strips applied.  She is able to get the incision wet in shower.  She is dry the proximal portion of the incision after showering.  Keep this area dry.  She will follow-up with [place] in a month sooner if there is any questions concerns.  We will plan on proceeding with a right total hip arthroplasty sometime in December.  Questions were encouraged and answered at length.  A surgery scheduling sheet was filled out today.

## 2019-07-18 ENCOUNTER — Inpatient Hospital Stay: Payer: Medicare Other | Admitting: Orthopaedic Surgery

## 2019-07-20 ENCOUNTER — Telehealth: Payer: Self-pay

## 2019-07-20 NOTE — Telephone Encounter (Signed)
Please refer to pt concern/request

## 2019-07-20 NOTE — Telephone Encounter (Signed)
Patient called in stating insurance denied her for her Prolia injection. she's wanting someone to send a letter letting them know why she was getting the injection and needs to get it     Please call and advise

## 2019-07-25 ENCOUNTER — Telehealth: Payer: Self-pay | Admitting: Radiology

## 2019-07-25 ENCOUNTER — Encounter: Payer: Self-pay | Admitting: Orthopaedic Surgery

## 2019-07-25 ENCOUNTER — Ambulatory Visit (INDEPENDENT_AMBULATORY_CARE_PROVIDER_SITE_OTHER): Payer: Medicare Other | Admitting: Orthopaedic Surgery

## 2019-07-25 ENCOUNTER — Other Ambulatory Visit: Payer: Self-pay

## 2019-07-25 DIAGNOSIS — Z96642 Presence of left artificial hip joint: Secondary | ICD-10-CM

## 2019-07-25 MED ORDER — FLUCONAZOLE 100 MG PO TABS: 100.0000 mg | ORAL_TABLET | ORAL | 0 refills | Status: DC

## 2019-07-25 NOTE — Progress Notes (Signed)
HPI: Cynthia Li returns today due to a rash around her wound.  She is now 24 days status post left total hip arthroplasty.  States the top the incisions and a completely healed.  Physical exam: Rash in both inguinal folds and anterior aspect of both hips consistent with candidiasis.  Surgical incision well-healed except for the very proximal incision slight dehiscence but no evidence of infection.  Impression: Status post left total hip arthroplasty 07/01/2019  Candidiasis  Plan: She is given mupirocin to apply a thin layer to the very proximal incision on the left.  Place her on Diflucan x3 doses.  And follow-up at her regular scheduled appointment sooner if there is any questions concerns.

## 2019-07-25 NOTE — Telephone Encounter (Signed)
Received voicemail on triage line from patient stating that she has what she believes is a yeast infection from one of the antibiotics that she was given in the hospital. She has been putting ointment on it, but it seems to be spreading and is moving towards the bottom of her incision.  The bottom of her incision is closed, but she still has a small opening at the top of the incision that she has been cleaning with antibacterial soap and covering with a bandaid.  She is not sure if she should continue to use the ointment.    I called patient. She states that it does itch, but is not weeping. She has had this since surgery, but it seems to be getting worse.   I advised I would speak with Dr. Ninfa Linden or Artis Delay to see if they would like for her to come in to have this checked and will call her back.

## 2019-07-26 ENCOUNTER — Encounter: Payer: Self-pay | Admitting: Obstetrics & Gynecology

## 2019-07-26 ENCOUNTER — Ambulatory Visit (INDEPENDENT_AMBULATORY_CARE_PROVIDER_SITE_OTHER): Payer: Medicare Other | Admitting: Obstetrics & Gynecology

## 2019-07-26 VITALS — BP 122/70 | HR 80 | Temp 98.0°F | Resp 12 | Ht 65.0 in | Wt 222.4 lb

## 2019-07-26 DIAGNOSIS — Z01419 Encounter for gynecological examination (general) (routine) without abnormal findings: Secondary | ICD-10-CM | POA: Diagnosis not present

## 2019-07-26 NOTE — Patient Instructions (Signed)
Lamisil and Lotrimin.  Over the counter topical yeast medication.

## 2019-07-26 NOTE — Progress Notes (Signed)
63 y.o. G38P1011 Single White or Caucasian female here for annual exam.  Had hip replacement 07/01/2019 with Dr. Magnus Ivan.  Having second replacement in December.  Doesn't have any pain in the left hip any longer.  Has developed yeast near incision on hip and in the groin from antibiotics given in the hospital with hip surgery.  Surprisingly, back pain has significantly improved.    Has osteoporosis of femur.  Started Prolia but only had one shot.  Was being managed by [facility] endocrinology.    Denies vaginal bleeding.   Patient's last menstrual period was 09/23/2007.          Sexually active: No.  The current method of family planning is status post hysterectomy.    Exercising: No.  The patient does not participate in regular exercise at present. Smoker:  no  Health Maintenance: Pap:  06/22/06 negative  History of abnormal Pap:  no MMG:  05/25/19 BIRADS 1 negative/density b Colonoscopy:  Did cologuard with Dr. Sharon Mt this year.   BMD:  04/05/18 Osteoporosis TDaP:  05/10/12 Pneumonia vaccine(s):  never Shingrix:   never Hep C testing: 11/24/16 Negative Screening Labs: PCP   reports that she has never smoked. She has never used smokeless tobacco. She reports current alcohol use. She reports that she does not use drugs.  Past Medical History:  Diagnosis Date  . Arthritis   . Complication of anesthesia    once when teeth were numb made heart race   . Family history of adverse reaction to anesthesia    father- slow to wake up   . GERD (gastroesophageal reflux disease)   . Hiatal hernia   . History of kidney stones   . IBS (irritable bowel syndrome)    age 64  . Kidney stone 3/14  . Lumbar disc disease   . Panic disorder   . Pinched nerve    some DDD    Past Surgical History:  Procedure Laterality Date  . BACK SURGERY  09/13/2015   Back Fusion- [facility]   . CHOLECYSTECTOMY  age 26  . CYSTOSCOPY W/ URETERAL STENT PLACEMENT Left 10/13/2014   Procedure: CYSTOSCOPY WITH  RETROGRADE PYELOGRAM/URETERAL STENT PLACEMENT;  Surgeon: Sebastian Ache, MD;  Location: [facility];  Service: Urology;  Laterality: Left;  . HOLMIUM LASER APPLICATION Left 10/13/2014   Procedure: HOLMIUM LASER APPLICATION;  Surgeon: Sebastian Ache, MD;  Location: [facility];  Service: Urology;  Laterality: Left;  . KNEE ARTHROSCOPY     right  . NEPHROLITHOTOMY Left 10/13/2014   Procedure: NEPHROLITHOTOMY PERCUTANEOUS WITH SURGEON ACCESS;  Surgeon: Sebastian Ache, MD;  Location: [facility];  Service: Urology;  Laterality: Left;  . TOTAL ABDOMINAL HYSTERECTOMY  09/2006  . TOTAL HIP ARTHROPLASTY Left 07/01/2019   Procedure: LEFT TOTAL HIP ARTHROPLASTY ANTERIOR APPROACH;  Surgeon: Kathryne Hitch, MD;  Location: [facility];  Service: Orthopedics;  Laterality: Left;    Current Outpatient Medications  Medication Sig Dispense Refill  . ALPRAZolam (XANAX) 0.5 MG tablet Take 0.5 mg by mouth daily as needed (panic attacks.).   0  . aspirin 81 MG chewable tablet Chew 1 tablet (81 mg total) by mouth 2 (two) times daily. 30 tablet 0  . Cholecalciferol (VITAMIN D-3) 125 MCG (5000 UT) TABS Take 1 tablet by mouth daily. (Patient taking differently: Take 5,000 Units by mouth at bedtime. ) 90 tablet 3  . denosumab (PROLIA) 60 MG/ML SOSY injection Inject 60 mg into the skin every 6 (six) months. In upper arm, abdomen or thigh.  1 mL 1  . diclofenac sodium (VOLTAREN) 1 % GEL Apply 1 application topically daily as needed (arthritis in hands).   3  . fluconazole (DIFLUCAN) 100 MG tablet Take 1 tablet (100 mg total) by mouth every 3 (three) days. 3 tablet 0  . Glucos-Chond-Hyal Ac-Ca Fructo (MOVE FREE JOINT HEALTH ADVANCE PO) Take 1 tablet by mouth every evening.    Marland Kitchen. levocetirizine (XYZAL) 5 MG tablet Take 5 mg by mouth at bedtime.     . magnesium oxide (MAG-OX) 400 MG tablet Take 800 mg by mouth at bedtime.    . Menaquinone-7 (VITAMIN K2 PO) Take 1 tablet by mouth at bedtime. Vitamin K2 (as Menaquinone-7) (MK-7)    .  methocarbamol (ROBAXIN) 500 MG tablet Take 1 tablet (500 mg total) by mouth every 8 (eight) hours as needed for muscle spasms. 30 tablet 0  . Multiple Vitamin (MULTIVITAMIN WITH MINERALS) TABS tablet Take 1 tablet by mouth daily. One-A-Day Multivitamin Women 50+    . mupirocin ointment (BACTROBAN) 2 % Place 1 application into the nose 2 (two) times daily.    . Niacin (VITAMIN B-3 PO) Take by mouth.    . nystatin cream (MYCOSTATIN) Apply 1 application bid up to 7 days (Patient taking differently: Apply 1 application topically 2 (two) times daily as needed (skin irritation/yeast). ) 30 g 0  . Omega-3 Fatty Acids (OMEGA 3 500) 500 MG CAPS Take 500 mg by mouth every evening.    Marland Kitchen. oxyCODONE (OXY IR/ROXICODONE) 5 MG immediate release tablet Take 1 tablet (5 mg total) by mouth every 4 (four) hours as needed for moderate pain (pain score 4-6). 40 tablet 0  . pantoprazole (PROTONIX) 40 MG tablet Take 40 mg by mouth daily.    . vitamin E 400 UNIT capsule Take 400 Units by mouth at bedtime.     No current facility-administered medications for this visit.     Family History  Problem Relation Age of Onset  . Parkinson's disease Father        deceased  . Cancer Father        renal tumor  . Heart attack Father   . Alzheimer's disease Father   . Ulcerative colitis Sister   . Breast cancer Neg Hx     Review of Systems  Constitutional: Negative.   HENT: Negative.   Eyes: Negative.   Respiratory: Negative.   Cardiovascular: Negative.   Gastrointestinal: Negative.   Endocrine: Negative.   Genitourinary: Negative.   Musculoskeletal: Negative.   Skin: Negative.   Allergic/Immunologic: Negative.   Neurological: Negative.   Hematological: Negative.   Psychiatric/Behavioral: Negative.     Exam:   BP 122/70 (BP Location: Right Arm, Patient Position: Sitting, Cuff Size: Large)   Pulse 80   Temp 98 F (36.7 C) (Temporal)   Resp 12   Ht 5\' 5"  (1.651 m)   Wt 222 lb 6.4 oz (100.9 kg)   LMP  09/23/2007   BMI 37.01 kg/m    Height: 5\' 5"  (165.1 cm)  Ht Readings from Last 3 Encounters:  07/26/19 5\' 5"  (1.651 m)  07/01/19 5\' 5"  (1.651 m)  06/22/19 5\' 5"  (1.651 m)   General appearance: alert, cooperative and appears stated age Head: Normocephalic, without obvious abnormality, atraumatic Neck: no adenopathy, supple, symmetrical, trachea midline and thyroid normal to inspection and palpation Lungs: clear to auscultation bilaterally Breasts: normal appearance, no masses or tenderness Heart: regular rate and rhythm Abdomen: soft, non-tender; bowel sounds normal; no masses,  no organomegaly  Extremities: extremities normal, atraumatic, no cyanosis or edema Skin: Skin color, texture, turgor normal. No rashes or lesions Lymph nodes: Cervical, supraclavicular, and axillary nodes normal. No abnormal inguinal nodes palpated Neurologic: Grossly normal   Pelvic: External genitalia:  no lesions              Urethra:  normal appearing urethra with no masses, tenderness or lesions              Bartholins and Skenes: normal                 Vagina: normal appearing vagina with normal color and discharge, no lesions              Cervix: absent              Pap taken: No. Bimanual Exam:  Uterus:  uterus absent              Adnexa: no mass, fullness, tenderness               Rectovaginal: Confirms               Anus:  normal sphincter tone, no lesions  Chaperone was present for exam.  A:  Well Woman with normal exam PMP, no HRT Recent hip replacement 06/2019 with right replacement scheduled in December H/o elevated lipids Inner thigh yeast GERD  P:   Mammogram guidelines reviewed pap smear not indicated Release for cologuard signed today Declines flu shot Will plan to repeat BMD in 03/2019 Return annually or prn

## 2019-07-27 ENCOUNTER — Telehealth: Payer: Self-pay | Admitting: Orthopaedic Surgery

## 2019-07-27 NOTE — Telephone Encounter (Signed)
Called patient today to get her scheduled for nurse visit to get Prolia-patient stated she does not think she will get the injection this year because she is having surgery in Dec and her other MD told her she should wait until after surgery-patient will call back with her decision after speaking to other MD

## 2019-07-27 NOTE — Telephone Encounter (Signed)
Yes she needs to be seen at some point please

## 2019-07-27 NOTE — Telephone Encounter (Signed)
Patient called this morning stating that she was getting into bed and felt something either move or pop and is now in pain and wants to know if she needs to be seen by Dr. Ninfa Linden.  CB#[phone].  Thank you.

## 2019-07-29 NOTE — Telephone Encounter (Signed)
IC patient to schedule an appointment for her hip pain.  Patient stated that the pain has gone away and does not feel like she needs to see him before her appointment on the 19th.  Thank you.

## 2019-08-04 ENCOUNTER — Telehealth: Payer: Self-pay | Admitting: Internal Medicine

## 2019-08-04 NOTE — Telephone Encounter (Signed)
LM to set up Prolia Approval complete No Copay Clear to schedule asap

## 2019-08-04 NOTE — Telephone Encounter (Signed)
-----   Message from Philemon Kingdom, MD sent at 06/02/2019 11:12 AM EDT ----- Colletta [place], Can you please submit this pt to the portal? She has been on Prolia x 1 in 09/2018 and could not afford it afterwards.  However, she now changed insurances and she will most likely be able to receive it again.  I would like her to have the injection, if approved, in the second half of next month (October). Thank you, C ----- Message ----- From: Interface, [facility] Sent: 06/01/2019  12:55 PM EDT To: Philemon Kingdom, MD

## 2019-08-05 ENCOUNTER — Other Ambulatory Visit: Payer: Self-pay

## 2019-08-11 ENCOUNTER — Encounter: Payer: Self-pay | Admitting: Orthopaedic Surgery

## 2019-08-11 ENCOUNTER — Ambulatory Visit (INDEPENDENT_AMBULATORY_CARE_PROVIDER_SITE_OTHER): Payer: Medicare Other | Admitting: Orthopaedic Surgery

## 2019-08-11 ENCOUNTER — Other Ambulatory Visit: Payer: Self-pay

## 2019-08-11 DIAGNOSIS — Z96642 Presence of left artificial hip joint: Secondary | ICD-10-CM

## 2019-08-11 DIAGNOSIS — M1611 Unilateral primary osteoarthritis, right hip: Secondary | ICD-10-CM

## 2019-08-11 NOTE — Progress Notes (Signed)
The patient is well-known to me.  She is 6 weeks now status post a left total hip arthroplasty.  She is doing very well that hip.  She is actually scheduled to have her right hip replaced on December 11.  She is still walking with a rolling walker due to the severity of her right hip pain.  She says she has great strength and good motion of her left hip and she is almost essentially pain-free in the left hip.  Her only complaint is some firmness at the incision area in the proximal thigh which I told her is to be expected at this point and I recommended trying a heating pad.  On exam I can easily put her left hip through internal and external rotation with no difficulty at all.  Her right hip is very painful with attempts of rotation and stiffly rotation as well.  At this point we will see her on December 11 for a right total hip arthroplasty.  Having had this before she is fully aware of the risk and benefits of surgery and what this involves.  She does have severe end-stage arthritis of the right hip.  We will then see her back on December 28 for first postoperative visit.  All question concerns were answered and addressed.

## 2019-08-24 ENCOUNTER — Telehealth: Payer: Self-pay

## 2019-08-24 NOTE — Telephone Encounter (Signed)
Received a fax request from [facility] to release medical records to [facility].  We do not handle these in clinic, they must be processed through Medical Records Department. I have faxed the paperwork to medical records.

## 2019-08-29 ENCOUNTER — Other Ambulatory Visit: Payer: Self-pay | Admitting: Physician Assistant

## 2019-08-30 ENCOUNTER — Other Ambulatory Visit (HOSPITAL_COMMUNITY)
Admission: RE | Admit: 2019-08-30 | Discharge: 2019-08-30 | Disposition: A | Discharge status: Home / Self Care | Payer: Medicare Other | Source: Ambulatory Visit | Attending: Orthopaedic Surgery | Admitting: Orthopaedic Surgery

## 2019-08-30 DIAGNOSIS — Z20828 Contact with and (suspected) exposure to other viral communicable diseases: Secondary | ICD-10-CM | POA: Diagnosis not present

## 2019-08-30 DIAGNOSIS — Z01812 Encounter for preprocedural laboratory examination: Secondary | ICD-10-CM | POA: Insufficient documentation

## 2019-08-30 NOTE — Patient Instructions (Signed)
DUE TO COVID-19 ONLY ONE VISITOR IS ALLOWED TO COME WITH YOU AND STAY IN THE WAITING ROOM ONLY DURING PRE OP AND PROCEDURE DAY OF SURGERY. THE 1 VISITOR MAY VISIT WITH YOU AFTER SURGERY IN YOUR PRIVATE ROOM DURING VISITING HOURS ONLY!   ONCE YOUR COVID TEST IS COMPLETED, PLEASE BEGIN THE QUARANTINE INSTRUCTIONS AS OUTLINED IN YOUR HANDOUT.                Cynthia Li    Your procedure is scheduled on: 09/02/19   [facility]  [facility] Report to Short stay at 5:30  AM     Call this number if you have problems the morning of surgery [phone]   BRUSH YOUR TEETH MORNING OF SURGERY AND RINSE YOUR MOUTH OUT, NO CHEWING GUM CANDY OR MINTS.   Do not eat food After Midnight.   YOU MAY HAVE CLEAR LIQUIDS FROM MIDNIGHT UNTIL 4:30AM.   At 4:30AM Please finish the prescribed Pre-Surgery Gatorade drink  . Nothing by mouth after you finish the Gatorade drink !    Take these medicines the morning of surgery with A SIP OF WATER:  Pantoprazole, Xanax if needed                                 You may not have any metal on your body including hair pins and              piercings  Do not wear jewelry, make-up, lotions, powders or perfumes, deodorant             Do not wear nail polish on your fingernails.  Do not shave  48 hours prior to surgery.                 Do not bring valuables to the hospital. [facility] IS NOT             RESPONSIBLE   FOR VALUABLES.  Contacts, dentures or bridgework may not be worn into surgery.       Patients discharged the day of surgery will not be allowed to drive home.  IF YOU ARE HAVING SURGERY AND GOING HOME THE SAME DAY, YOU MUST HAVE AN ADULT TO DRIVE YOU HOME AND BE WITH YOU FOR 24 HOURS.  YOU MAY GO HOME BY TAXI OR UBER OR ORTHERWISE, BUT AN ADULT MUST ACCOMPANY YOU HOME AND STAY WITH YOU FOR 24 HOURS.  Name and phone number of your driver:  Special Instructions: N/A              Please read over the following fact sheets  you were given: _____________________________________________________________________             [facility] - Preparing for [facility]  Before surgery, you can play an important role.   Because skin is not sterile, your skin needs to be as free of germs as possible.   You can reduce the number of germs on your skin by washing with CHG (chlorahexidine gluconate) soap before surgery.   CHG is an antiseptic cleaner which kills germs and bonds with the skin to continue killing germs even after washing. Please DO NOT use if you have an allergy to CHG or antibacterial soaps.   If your skin becomes reddened/irritated stop using the CHG and inform your nurse when you arrive at Short Stay. Do not shave (including legs and underarms) for at  least 48 hours prior to the first CHG shower.    Please follow these instructions carefully:  1.  Shower with CHG Soap the night before surgery and the  morning of Surgery.  2.  If you choose to wash your hair, wash your hair first as usual with your  normal  shampoo.  3.  After you shampoo, rinse your hair and body thoroughly to remove the  shampoo.                                        4.  Use CHG as you would any other liquid soap.  You can apply chg directly  to the skin and wash                       Gently with a scrungie or clean washcloth.  5.  Apply the CHG Soap to your body ONLY FROM THE NECK DOWN.   Do not use on face/ open                           Wound or open sores. Avoid contact with eyes, ears mouth and genitals (private parts).                       Wash face,  Genitals (private parts) with your normal soap.             6.  Wash thoroughly, paying special attention to the area where your surgery  will be performed.  7.  Thoroughly rinse your body with warm water from the neck down.  8.  DO NOT shower/wash with your normal soap after using and rinsing off  the CHG Soap.             9.  Pat yourself dry with a clean towel.            10.  Wear  clean pajamas.            11.  Place clean sheets on your bed the night of your first shower and do not  sleep with pets. Day of Surgery : Do not apply any lotions/deodorants the morning of surgery.  Please wear clean clothes to the hospital/surgery center.  FAILURE TO FOLLOW THESE INSTRUCTIONS MAY RESULT IN THE CANCELLATION OF YOUR SURGERY PATIENT SIGNATURE_________________________________  NURSE SIGNATURE__________________________________  ________________________________________________________________________   Cynthia Li  An incentive spirometer is a tool that can help keep your lungs clear and active. This tool measures how well you are filling your lungs with each breath. Taking long deep breaths may help reverse or decrease the chance of developing breathing (pulmonary) problems (especially infection) following:  A long period of time when you are unable to move or be active. BEFORE THE PROCEDURE   If the spirometer includes an indicator to show your best effort, your nurse or respiratory therapist will set it to a desired goal.  If possible, sit up straight or lean slightly forward. Try not to slouch.  Hold the incentive spirometer in an upright position. INSTRUCTIONS FOR USE  1. Sit on the edge of your bed if possible, or sit up as far as you can in bed or on a chair. 2. Hold the incentive spirometer in an upright position. 3. Breathe out normally. 4. Place the mouthpiece in your mouth  and seal your lips tightly around it. 5. Breathe in slowly and as deeply as possible, raising the piston or the ball toward the top of the column. 6. Hold your breath for 3-5 seconds or for as long as possible. Allow the piston or ball to fall to the bottom of the column. 7. Remove the mouthpiece from your mouth and breathe out normally. 8. Rest for a few seconds and repeat Steps 1 through 7 at least 10 times every 1-2 hours when you are awake. Take your time and take a few normal  breaths between deep breaths. 9. The spirometer may include an indicator to show your best effort. Use the indicator as a goal to work toward during each repetition. 10. After each set of 10 deep breaths, practice coughing to be sure your lungs are clear. If you have an incision (the cut made at the time of surgery), support your incision when coughing by placing a pillow or rolled up towels firmly against it. Once you are able to get out of bed, walk around indoors and cough well. You may stop using the incentive spirometer when instructed by your caregiver.  RISKS AND COMPLICATIONS  Take your time so you do not get dizzy or light-headed.  If you are in pain, you may need to take or ask for pain medication before doing incentive spirometry. It is harder to take a deep breath if you are having pain. AFTER USE  Rest and breathe slowly and easily.  It can be helpful to keep track of a log of your progress. Your caregiver can provide you with a simple table to help with this. If you are using the spirometer at home, follow these instructions: SEEK MEDICAL CARE IF:   You are having difficultly using the spirometer.  You have trouble using the spirometer as often as instructed.  Your pain medication is not giving enough relief while using the spirometer.  You develop fever of 100.5 F (38.1 C) or higher. SEEK IMMEDIATE MEDICAL CARE IF:   You cough up bloody sputum that had not been present before.  You develop fever of 102 F (38.9 C) or greater.  You develop worsening pain at or near the incision site. MAKE SURE YOU:   Understand these instructions.  Will watch your condition.  Will get help right away if you are not doing well or get worse. Document Released: 01/19/2007 Document Revised: 12/01/2011 Document Reviewed: 03/22/2007 [facility] Patient Information 2014 [place], [place].   ________________________________________________________________________

## 2019-08-31 ENCOUNTER — Other Ambulatory Visit: Payer: Self-pay

## 2019-08-31 ENCOUNTER — Encounter (HOSPITAL_COMMUNITY): Payer: Self-pay

## 2019-08-31 ENCOUNTER — Encounter (HOSPITAL_COMMUNITY)
Admission: RE | Admit: 2019-08-31 | Discharge: 2019-08-31 | Disposition: A | Discharge status: Home / Self Care | Payer: Medicare Other | Source: Ambulatory Visit | Attending: Orthopaedic Surgery | Admitting: Orthopaedic Surgery

## 2019-08-31 DIAGNOSIS — M1611 Unilateral primary osteoarthritis, right hip: Secondary | ICD-10-CM | POA: Insufficient documentation

## 2019-08-31 DIAGNOSIS — Z01812 Encounter for preprocedural laboratory examination: Secondary | ICD-10-CM | POA: Insufficient documentation

## 2019-08-31 LAB — BASIC METABOLIC PANEL
Anion gap: 10 (ref 5–15)
BUN: 12 mg/dL (ref 8–23)
CO2: 26 mmol/L (ref 22–32)
Calcium: 9.8 mg/dL (ref 8.9–10.3)
Chloride: 103 mmol/L (ref 98–111)
Creatinine, Ser: 0.63 mg/dL (ref 0.44–1.00)
GFR calc Af Amer: >60 mL/min (ref >60)
GFR calc non Af Amer: >60 mL/min (ref >60)
Glucose, Bld: 99 mg/dL (ref 70–99)
Potassium: 4 mmol/L (ref 3.5–5.1)
Sodium: 139 mmol/L (ref 135–145)

## 2019-08-31 LAB — NOVEL CORONAVIRUS, NAA (HOSP ORDER, SEND-OUT TO REF LAB; TAT 18-24 HRS): SARS-CoV-2, NAA: NOT DETECTED

## 2019-08-31 LAB — CBC
HCT: 42.4 % (ref 36.0–46.0)
Hemoglobin: 13.3 g/dL (ref 12.0–15.0)
MCH: 28.9 pg (ref 26.0–34.0)
MCHC: 31.4 g/dL (ref 30.0–36.0)
MCV: 92 fL (ref 80.0–100.0)
Platelets: 263 10*3/uL (ref 150–400)
RBC: 4.61 MIL/uL (ref 3.87–5.11)
RDW: 13 % (ref 11.5–15.5)
WBC: 5.2 10*3/uL (ref 4.0–10.5)
nRBC: 0 % (ref 0.0–0.2)

## 2019-08-31 LAB — SURGICAL PCR SCREEN
MRSA, PCR: NEGATIVE
Staphylococcus aureus: NEGATIVE

## 2019-08-31 NOTE — Progress Notes (Signed)
PCP - Dr. Hoover Brunette Cardiologist - no  Chest x-ray - no EKG - no Stress Test - no ECHO - no Cardiac Cath - no  Sleep Study - no CPAP -   Fasting Blood Sugar - NA Checks Blood Sugar _____ times a day  Blood Thinner Instructions:NA Aspirin Instructions: Last Dose:  Anesthesia review:   Patient denies shortness of breath, fever, cough and chest pain at PAT appointment yes  Patient verbalized understanding of instructions that were given to them at the PAT appointment. Patient was also instructed that they will need to review over the PAT instructions again at home before surgery. yes

## 2019-09-01 ENCOUNTER — Telehealth: Payer: Self-pay | Admitting: Orthopaedic Surgery

## 2019-09-01 ENCOUNTER — Encounter (HOSPITAL_COMMUNITY): Payer: Self-pay | Admitting: Orthopaedic Surgery

## 2019-09-01 NOTE — Telephone Encounter (Signed)
Received vm from patient needing Dx info fom 10/9-10/11 sent to her insur [employer]. IC,lmvm advised we are unable to release anything before we have signed authorization. Stated that perhaps she can complete at her next ov if she is unable to sooner.

## 2019-09-01 NOTE — Anesthesia Preprocedure Evaluation (Addendum)
Anesthesia Evaluation  Patient identified by MRN, date of birth, ID band Patient awake    Reviewed: Allergy & Precautions, NPO status , Patient's Chart, lab work & pertinent test results  History of Anesthesia Complications Negative for: history of anesthetic complications  Airway Mallampati: II  TM Distance: >3 FB Neck ROM: Full    Dental  (+) Teeth Intact, Dental Advisory Given,    Pulmonary neg pulmonary ROS,    Pulmonary exam normal breath sounds clear to auscultation       Cardiovascular negative cardio ROS Normal cardiovascular exam Rhythm:Regular Rate:Normal     Neuro/Psych PSYCHIATRIC DISORDERS Anxiety negative neurological ROS     GI/Hepatic Neg liver ROS, hiatal hernia, GERD  Medicated and Controlled,IBS   Endo/Other  Obesity BMI 37  Renal/GU Renal diseaseHx nephrolithiasis  negative genitourinary   Musculoskeletal  (+) Arthritis , Osteoarthritis,  DDD, lumbar stenosis with neurogenic claudication s/p back surgery 2016   Abdominal (+) + obese,   Peds  Hematology negative hematology ROS (+)   Anesthesia Other Findings Other hip done 06/2019 under spinal  Reproductive/Obstetrics negative OB ROS                          Anesthesia Physical Anesthesia Plan  ASA: III  Anesthesia Plan: Spinal   Post-op Pain Management:    Induction: Intravenous  PONV Risk Score and Plan: 2 and Propofol infusion, TIVA and Treatment may vary due to age or medical condition  Airway Management Planned: Natural Airway and Simple Face Mask  Additional Equipment: None  Intra-op Plan:   Post-operative Plan:   Informed Consent: I have reviewed the patients History and Physical, chart, labs and discussed the procedure including the risks, benefits and alternatives for the proposed anesthesia with the patient or authorized representative who has indicated his/her understanding and acceptance.        Plan Discussed with: CRNA  Anesthesia Plan Comments:         Anesthesia Quick Evaluation

## 2019-09-01 NOTE — H&P (Signed)
TOTAL HIP ADMISSION H&P  Patient is admitted for right total hip arthroplasty.  Subjective:  Chief Complaint: right hip pain  HPI: Cynthia Li, 63 y.o. female, has a history of pain and functional disability in the right hip(s) due to arthritis and patient has failed non-surgical conservative treatments for greater than 12 weeks to include NSAID's and/or analgesics, corticosteriod injections, supervised PT with diminished ADL's post treatment, use of assistive devices and activity modification.  Onset of symptoms was gradual starting 3 years ago with gradually worsening course since that time.The patient noted no past surgery on the right hip(s).  Patient currently rates pain in the right hip at 10 out of 10 with activity. Patient has night pain, worsening of pain with activity and weight bearing, trendelenberg gait, pain that interfers with activities of daily living and pain with passive range of motion. Patient has evidence of subchondral cysts, subchondral sclerosis, periarticular osteophytes and joint space narrowing by imaging studies. This condition presents safety issues increasing the risk of falls.  There is no current active infection.  Patient Active Problem List   Diagnosis Date Noted  . Unilateral primary osteoarthritis, right hip 07/14/2019  . Unilateral primary osteoarthritis, left hip 07/01/2019  . Status post total replacement of left hip 07/01/2019  . Age-related osteoporosis without current pathological fracture 06/02/2019  . Vitamin D insufficiency 06/02/2019  . Lumbar stenosis with neurogenic claudication 09/11/2015  . Renal stone 10/13/2014   Past Medical History:  Diagnosis Date  . Arthritis   . GERD (gastroesophageal reflux disease)   . Hiatal hernia   . History of kidney stones   . IBS (irritable bowel syndrome)    age 38  . Kidney stone 3/14  . Lumbar disc disease   . Panic disorder   . Pinched nerve    some DDD    Past Surgical History:  Procedure  Laterality Date  . BACK SURGERY  09/13/2015   Back Fusion- [facility]   . CHOLECYSTECTOMY  age 62  . CYSTOSCOPY W/ URETERAL STENT PLACEMENT Left 10/13/2014   Procedure: CYSTOSCOPY WITH RETROGRADE PYELOGRAM/URETERAL STENT PLACEMENT;  Surgeon: Sebastian Ache, MD;  Location: [facility];  Service: Urology;  Laterality: Left;  . HOLMIUM LASER APPLICATION Left 10/13/2014   Procedure: HOLMIUM LASER APPLICATION;  Surgeon: Sebastian Ache, MD;  Location: [facility];  Service: Urology;  Laterality: Left;  . KNEE ARTHROSCOPY     right  . NEPHROLITHOTOMY Left 10/13/2014   Procedure: NEPHROLITHOTOMY PERCUTANEOUS WITH SURGEON ACCESS;  Surgeon: Sebastian Ache, MD;  Location: [facility];  Service: Urology;  Laterality: Left;  . TOTAL ABDOMINAL HYSTERECTOMY  09/2006  . TOTAL HIP ARTHROPLASTY Left 07/01/2019   Procedure: LEFT TOTAL HIP ARTHROPLASTY ANTERIOR APPROACH;  Surgeon: Kathryne Hitch, MD;  Location: [facility];  Service: Orthopedics;  Laterality: Left;    No current facility-administered medications for this encounter.   Current Outpatient Medications  Medication Sig Dispense Refill Last Dose  . ALPRAZolam (XANAX) 0.5 MG tablet Take 0.5 mg by mouth daily as needed (panic attacks.).   0   . Cholecalciferol (VITAMIN D-3) 125 MCG (5000 UT) TABS Take 1 tablet by mouth daily. (Patient taking differently: Take 5,000 Units by mouth at bedtime. ) 90 tablet 3   . diclofenac sodium (VOLTAREN) 1 % GEL Apply 1 application topically daily as needed (arthritis in hands).   3   . Glucos-Chond-Hyal Ac-Ca Fructo (MOVE FREE JOINT HEALTH ADVANCE PO) Take 1 tablet by mouth every evening.     Marland Kitchen levocetirizine (  XYZAL) 5 MG tablet Take 5 mg by mouth at bedtime.      . magnesium oxide (MAG-OX) 400 MG tablet Take 800 mg by mouth at bedtime.     . Menaquinone-7 (VITAMIN K2 PO) Take 1 tablet by mouth at bedtime. Vitamin K2 (as Menaquinone-7) (MK-7)     . Multiple Vitamin (MULTIVITAMIN WITH MINERALS) TABS tablet Take 1 tablet by  mouth daily. One-A-Day Multivitamin Women 50+     . nystatin cream (MYCOSTATIN) Apply 1 application bid up to 7 days (Patient taking differently: Apply 1 application topically 2 (two) times daily as needed (skin irritation/yeast). ) 30 g 0   . Omega-3 Fatty Acids (OMEGA 3 500) 500 MG CAPS Take 500 mg by mouth every evening.     . pantoprazole (PROTONIX) 40 MG tablet Take 40 mg by mouth daily.     . vitamin E 400 UNIT capsule Take 400 Units by mouth at bedtime.     Marland Kitchen denosumab (PROLIA) 60 MG/ML SOSY injection Inject 60 mg into the skin every 6 (six) months. In upper arm, abdomen or thigh. 1 mL 1   . Niacin (VITAMIN B-3 PO) Take by mouth.        Social History   Tobacco Use  . Smoking status: Never Smoker  . Smokeless tobacco: Never Used  Substance Use Topics  . Alcohol use: Yes    Comment: occ    Family History  Problem Relation Age of Onset  . Parkinson's disease Father        deceased  . Cancer Father        renal tumor  . Heart attack Father   . Alzheimer's disease Father   . Ulcerative colitis Sister   . Breast cancer Neg Hx      Review of Systems  All other systems reviewed and are negative.   Objective:  Physical Exam  Constitutional: She is oriented to person, place, and time. She appears well-developed and well-nourished.  HENT:  Head: Normocephalic and atraumatic.  Eyes: Pupils are equal, round, and reactive to light. EOM are normal.  Cardiovascular: Normal rate and regular rhythm.  Respiratory: Effort normal and breath sounds normal.  GI: Soft. Bowel sounds are normal.  Musculoskeletal:     Cervical back: Normal range of motion and neck supple.     Right hip: Tenderness and bony tenderness present. Decreased range of motion. Decreased strength.  Neurological: She is alert and oriented to person, place, and time.  Skin: Skin is warm and dry.  Psychiatric: She has a normal mood and affect.    Vital signs in last 24 hours:    Labs:   Estimated body mass  index is 36.61 kg/m as calculated from the following:   Height as of 08/31/19: 5\' 5"  (1.651 m).   Weight as of 08/31/19: 99.8 kg.   Imaging Review Plain radiographs demonstrate severe degenerative joint disease of the right hip(s). The bone quality appears to be good for age and reported activity level.      Assessment/Plan:  End stage arthritis, right hip(s)  The patient history, physical examination, clinical judgement of the provider and imaging studies are consistent with end stage degenerative joint disease of the right hip(s) and total hip arthroplasty is deemed medically necessary. The treatment options including medical management, injection therapy, arthroscopy and arthroplasty were discussed at length. The risks and benefits of total hip arthroplasty were presented and reviewed. The risks due to aseptic loosening, infection, stiffness, dislocation/subluxation,  thromboembolic complications and  other imponderables were discussed.  The patient acknowledged the explanation, agreed to proceed with the plan and consent was signed. Patient is being admitted for inpatient treatment for surgery, pain control, PT, OT, prophylactic antibiotics, VTE prophylaxis, progressive ambulation and ADL's and discharge planning.The patient is planning to be discharged home with home health services

## 2019-09-02 ENCOUNTER — Other Ambulatory Visit: Payer: Self-pay

## 2019-09-02 ENCOUNTER — Ambulatory Visit (HOSPITAL_COMMUNITY): Payer: Medicare Other | Admitting: Anesthesiology

## 2019-09-02 ENCOUNTER — Ambulatory Visit (HOSPITAL_COMMUNITY): Payer: Medicare Other

## 2019-09-02 ENCOUNTER — Observation Stay (HOSPITAL_COMMUNITY)
Admission: RE | Admit: 2019-09-02 | Discharge: 2019-09-03 | Disposition: A | Discharge status: Home / Self Care | Payer: Medicare Other | Attending: Orthopaedic Surgery | Admitting: Orthopaedic Surgery

## 2019-09-02 ENCOUNTER — Observation Stay (HOSPITAL_COMMUNITY): Payer: Medicare Other

## 2019-09-02 ENCOUNTER — Encounter (HOSPITAL_COMMUNITY)
Admission: RE | Disposition: A | Discharge status: Home / Self Care | Payer: Self-pay | Source: Home / Self Care | Attending: Orthopaedic Surgery

## 2019-09-02 ENCOUNTER — Ambulatory Visit (HOSPITAL_COMMUNITY): Payer: Medicare Other | Admitting: Physician Assistant

## 2019-09-02 ENCOUNTER — Encounter (HOSPITAL_COMMUNITY): Payer: Self-pay | Admitting: Orthopaedic Surgery

## 2019-09-02 DIAGNOSIS — E559 Vitamin D deficiency, unspecified: Secondary | ICD-10-CM | POA: Insufficient documentation

## 2019-09-02 DIAGNOSIS — R262 Difficulty in walking, not elsewhere classified: Secondary | ICD-10-CM | POA: Insufficient documentation

## 2019-09-02 DIAGNOSIS — Z791 Long term (current) use of non-steroidal anti-inflammatories (NSAID): Secondary | ICD-10-CM | POA: Insufficient documentation

## 2019-09-02 DIAGNOSIS — M810 Age-related osteoporosis without current pathological fracture: Secondary | ICD-10-CM | POA: Insufficient documentation

## 2019-09-02 DIAGNOSIS — M1611 Unilateral primary osteoarthritis, right hip: Principal | ICD-10-CM | POA: Insufficient documentation

## 2019-09-02 DIAGNOSIS — K589 Irritable bowel syndrome without diarrhea: Secondary | ICD-10-CM | POA: Diagnosis not present

## 2019-09-02 DIAGNOSIS — M48062 Spinal stenosis, lumbar region with neurogenic claudication: Secondary | ICD-10-CM | POA: Diagnosis not present

## 2019-09-02 DIAGNOSIS — K219 Gastro-esophageal reflux disease without esophagitis: Secondary | ICD-10-CM | POA: Insufficient documentation

## 2019-09-02 DIAGNOSIS — M6281 Muscle weakness (generalized): Secondary | ICD-10-CM | POA: Insufficient documentation

## 2019-09-02 DIAGNOSIS — Z79899 Other long term (current) drug therapy: Secondary | ICD-10-CM | POA: Diagnosis not present

## 2019-09-02 DIAGNOSIS — Z419 Encounter for procedure for purposes other than remedying health state, unspecified: Secondary | ICD-10-CM

## 2019-09-02 DIAGNOSIS — Z96641 Presence of right artificial hip joint: Secondary | ICD-10-CM

## 2019-09-02 DIAGNOSIS — Z96642 Presence of left artificial hip joint: Secondary | ICD-10-CM | POA: Insufficient documentation

## 2019-09-02 HISTORY — PX: TOTAL HIP ARTHROPLASTY: SHX124

## 2019-09-02 LAB — URINALYSIS, ROUTINE W REFLEX MICROSCOPIC
Bilirubin Urine: NEGATIVE
Glucose, UA: NEGATIVE mg/dL
Hgb urine dipstick: NEGATIVE
Ketones, ur: NEGATIVE mg/dL
Leukocytes,Ua: NEGATIVE
Nitrite: NEGATIVE
Protein, ur: 30 mg/dL — AB
Specific Gravity, Urine: 1.033 — ABNORMAL HIGH (ref 1.005–1.030)
pH: 5 (ref 5.0–8.0)

## 2019-09-02 SURGERY — ARTHROPLASTY, HIP, TOTAL, ANTERIOR APPROACH
Anesthesia: Spinal | Site: Hip | Laterality: Right

## 2019-09-02 MED ORDER — KETOROLAC TROMETHAMINE 15 MG/ML IJ SOLN
7.5000 mg | Freq: Four times a day (QID) | INTRAMUSCULAR | Status: AC
  Administered 2019-09-02 – 2019-09-03 (×4): 7.5 mg via INTRAVENOUS
  Filled 2019-09-02 (×4): qty 1

## 2019-09-02 MED ORDER — PROPOFOL 10 MG/ML IV BOLUS
INTRAVENOUS | Status: DC | PRN
  Administered 2019-09-02 (×3): 30 mg via INTRAVENOUS

## 2019-09-02 MED ORDER — ONDANSETRON HCL 4 MG/2ML IJ SOLN: 4.0000 mg | Freq: Four times a day (QID) | INTRAMUSCULAR | Status: DC | PRN

## 2019-09-02 MED ORDER — PROPOFOL 10 MG/ML IV BOLUS
INTRAVENOUS | Status: AC
  Filled 2019-09-02: qty 20

## 2019-09-02 MED ORDER — METHOCARBAMOL 500 MG PO TABS: 500.0000 mg | ORAL_TABLET | Freq: Four times a day (QID) | ORAL | Status: DC | PRN

## 2019-09-02 MED ORDER — METOCLOPRAMIDE HCL 5 MG PO TABS: 5.0000 mg | ORAL_TABLET | Freq: Three times a day (TID) | ORAL | Status: DC | PRN

## 2019-09-02 MED ORDER — POVIDONE-IODINE 10 % EX SWAB
2.0000 "application " | Freq: Once | CUTANEOUS | Status: AC
  Administered 2019-09-02: 2 via TOPICAL

## 2019-09-02 MED ORDER — METHOCARBAMOL 500 MG IVPB - SIMPLE MED
500.0000 mg | Freq: Four times a day (QID) | INTRAVENOUS | Status: DC | PRN
  Filled 2019-09-02: qty 50

## 2019-09-02 MED ORDER — TRANEXAMIC ACID-NACL 1000-0.7 MG/100ML-% IV SOLN
1000.0000 mg | INTRAVENOUS | Status: AC
  Administered 2019-09-02: 1000 mg via INTRAVENOUS
  Filled 2019-09-02: qty 100

## 2019-09-02 MED ORDER — ACETAMINOPHEN 500 MG PO TABS
1000.0000 mg | ORAL_TABLET | Freq: Once | ORAL | Status: AC
  Administered 2019-09-02: 1000 mg via ORAL
  Filled 2019-09-02: qty 2

## 2019-09-02 MED ORDER — BUPIVACAINE IN DEXTROSE 0.75-8.25 % IT SOLN
INTRATHECAL | Status: DC | PRN
  Administered 2019-09-02: 1.6 mL via INTRATHECAL

## 2019-09-02 MED ORDER — VITAMIN E 180 MG (400 UNIT) PO CAPS
400.0000 [IU] | ORAL_CAPSULE | Freq: Every day | ORAL | Status: DC
  Administered 2019-09-02: 400 [IU] via ORAL
  Filled 2019-09-02 (×2): qty 1

## 2019-09-02 MED ORDER — CHLORHEXIDINE GLUCONATE 4 % EX LIQD: 60.0000 mL | Freq: Once | CUTANEOUS | Status: DC

## 2019-09-02 MED ORDER — OXYCODONE HCL 5 MG PO TABS: 10.0000 mg | ORAL_TABLET | ORAL | Status: DC | PRN

## 2019-09-02 MED ORDER — DEXAMETHASONE SODIUM PHOSPHATE 10 MG/ML IJ SOLN
INTRAMUSCULAR | Status: AC
  Filled 2019-09-02: qty 1

## 2019-09-02 MED ORDER — GABAPENTIN 100 MG PO CAPS
100.0000 mg | ORAL_CAPSULE | Freq: Three times a day (TID) | ORAL | Status: DC
  Administered 2019-09-02 – 2019-09-03 (×3): 100 mg via ORAL
  Filled 2019-09-02 (×3): qty 1

## 2019-09-02 MED ORDER — MEPERIDINE HCL 50 MG/ML IJ SOLN: 6.2500 mg | INTRAMUSCULAR | Status: DC | PRN

## 2019-09-02 MED ORDER — ONDANSETRON HCL 4 MG/2ML IJ SOLN
INTRAMUSCULAR | Status: DC | PRN
  Administered 2019-09-02: 4 mg via INTRAVENOUS

## 2019-09-02 MED ORDER — DOCUSATE SODIUM 100 MG PO CAPS
100.0000 mg | ORAL_CAPSULE | Freq: Two times a day (BID) | ORAL | Status: DC
  Administered 2019-09-02 – 2019-09-03 (×2): 100 mg via ORAL
  Filled 2019-09-02 (×2): qty 1

## 2019-09-02 MED ORDER — CLINDAMYCIN PHOSPHATE 600 MG/50ML IV SOLN
600.0000 mg | Freq: Four times a day (QID) | INTRAVENOUS | Status: AC
  Administered 2019-09-02 (×2): 600 mg via INTRAVENOUS
  Filled 2019-09-02 (×2): qty 50

## 2019-09-02 MED ORDER — PHENOL 1.4 % MT LIQD: 1.0000 | OROMUCOSAL | Status: DC | PRN

## 2019-09-02 MED ORDER — METOCLOPRAMIDE HCL 5 MG/ML IJ SOLN: 5.0000 mg | Freq: Three times a day (TID) | INTRAMUSCULAR | Status: DC | PRN

## 2019-09-02 MED ORDER — DIPHENHYDRAMINE HCL 12.5 MG/5ML PO ELIX: 12.5000 mg | ORAL_SOLUTION | ORAL | Status: DC | PRN

## 2019-09-02 MED ORDER — MIDAZOLAM HCL 2 MG/2ML IJ SOLN
INTRAMUSCULAR | Status: AC
  Filled 2019-09-02: qty 2

## 2019-09-02 MED ORDER — SODIUM CHLORIDE 0.9 % IR SOLN
Status: DC | PRN
  Administered 2019-09-02: 1000 mL

## 2019-09-02 MED ORDER — SODIUM CHLORIDE 0.9 % IV SOLN
INTRAVENOUS | Status: DC
  Administered 2019-09-02: 11:00:00 via INTRAVENOUS

## 2019-09-02 MED ORDER — MENTHOL 3 MG MT LOZG: 1.0000 | LOZENGE | OROMUCOSAL | Status: DC | PRN

## 2019-09-02 MED ORDER — PHENYLEPHRINE HCL-NACL 10-0.9 MG/250ML-% IV SOLN
INTRAVENOUS | Status: DC | PRN
  Administered 2019-09-02: 25 ug/min via INTRAVENOUS

## 2019-09-02 MED ORDER — PHENYLEPHRINE HCL (PRESSORS) 10 MG/ML IV SOLN
INTRAVENOUS | Status: DC | PRN
  Administered 2019-09-02: 80 ug via INTRAVENOUS

## 2019-09-02 MED ORDER — DEXAMETHASONE SODIUM PHOSPHATE 10 MG/ML IJ SOLN
INTRAMUSCULAR | Status: DC | PRN
  Administered 2019-09-02: 10 mg via INTRAVENOUS

## 2019-09-02 MED ORDER — MAGNESIUM OXIDE 400 (241.3 MG) MG PO TABS
800.0000 mg | ORAL_TABLET | Freq: Every day | ORAL | Status: DC
  Administered 2019-09-02: 800 mg via ORAL
  Filled 2019-09-02: qty 2

## 2019-09-02 MED ORDER — ALUM & MAG HYDROXIDE-SIMETH 200-200-20 MG/5ML PO SUSP: 30.0000 mL | ORAL | Status: DC | PRN

## 2019-09-02 MED ORDER — STERILE WATER FOR IRRIGATION IR SOLN
Status: DC | PRN
  Administered 2019-09-02: 1000 mL

## 2019-09-02 MED ORDER — CLINDAMYCIN PHOSPHATE 900 MG/50ML IV SOLN
INTRAVENOUS | Status: AC
  Filled 2019-09-02: qty 50

## 2019-09-02 MED ORDER — OXYCODONE HCL 5 MG PO TABS
5.0000 mg | ORAL_TABLET | ORAL | Status: DC | PRN
  Administered 2019-09-03 (×2): 5 mg via ORAL
  Filled 2019-09-02 (×2): qty 1

## 2019-09-02 MED ORDER — ONDANSETRON HCL 4 MG/2ML IJ SOLN
INTRAMUSCULAR | Status: AC
  Filled 2019-09-02: qty 2

## 2019-09-02 MED ORDER — LACTATED RINGERS IV SOLN
INTRAVENOUS | Status: DC
  Administered 2019-09-02 (×2): via INTRAVENOUS

## 2019-09-02 MED ORDER — PANTOPRAZOLE SODIUM 40 MG PO TBEC
40.0000 mg | DELAYED_RELEASE_TABLET | Freq: Every day | ORAL | Status: DC
  Administered 2019-09-03: 40 mg via ORAL
  Filled 2019-09-02: qty 1

## 2019-09-02 MED ORDER — ACETAMINOPHEN 325 MG PO TABS: 325.0000 mg | ORAL_TABLET | Freq: Four times a day (QID) | ORAL | Status: DC | PRN

## 2019-09-02 MED ORDER — FENTANYL CITRATE (PF) 100 MCG/2ML IJ SOLN
INTRAMUSCULAR | Status: AC
  Filled 2019-09-02: qty 2

## 2019-09-02 MED ORDER — PROPOFOL 10 MG/ML IV BOLUS
INTRAVENOUS | Status: AC
  Filled 2019-09-02: qty 60

## 2019-09-02 MED ORDER — 0.9 % SODIUM CHLORIDE (POUR BTL) OPTIME
TOPICAL | Status: DC | PRN
  Administered 2019-09-02: 08:00:00 1000 mL

## 2019-09-02 MED ORDER — CALCIUM CARBONATE ANTACID 500 MG PO CHEW
800.0000 mg | CHEWABLE_TABLET | Freq: Once | ORAL | Status: DC
  Administered 2019-09-02: 800 mg via ORAL
  Filled 2019-09-02: qty 4

## 2019-09-02 MED ORDER — METHOCARBAMOL 500 MG IVPB - SIMPLE MED
INTRAVENOUS | Status: AC
  Filled 2019-09-02: qty 50

## 2019-09-02 MED ORDER — FENTANYL CITRATE (PF) 100 MCG/2ML IJ SOLN
INTRAMUSCULAR | Status: DC | PRN
  Administered 2019-09-02 (×2): 50 ug via INTRAVENOUS

## 2019-09-02 MED ORDER — KETOROLAC TROMETHAMINE 30 MG/ML IJ SOLN: 30.0000 mg | Freq: Once | INTRAMUSCULAR | Status: DC | PRN

## 2019-09-02 MED ORDER — LORATADINE 10 MG PO TABS
10.0000 mg | ORAL_TABLET | Freq: Every day | ORAL | Status: DC
  Administered 2019-09-02: 10 mg via ORAL
  Filled 2019-09-02: qty 1

## 2019-09-02 MED ORDER — OXYCODONE HCL 5 MG PO TABS: 5.0000 mg | ORAL_TABLET | Freq: Once | ORAL | Status: DC | PRN

## 2019-09-02 MED ORDER — HYDROMORPHONE HCL 1 MG/ML IJ SOLN: 0.5000 mg | INTRAMUSCULAR | Status: DC | PRN

## 2019-09-02 MED ORDER — ONDANSETRON HCL 4 MG PO TABS: 4.0000 mg | ORAL_TABLET | Freq: Four times a day (QID) | ORAL | Status: DC | PRN

## 2019-09-02 MED ORDER — POLYETHYLENE GLYCOL 3350 17 G PO PACK: 17.0000 g | PACK | Freq: Every day | ORAL | Status: DC | PRN

## 2019-09-02 MED ORDER — PROMETHAZINE HCL 25 MG/ML IJ SOLN: 6.2500 mg | INTRAMUSCULAR | Status: DC | PRN

## 2019-09-02 MED ORDER — HYDROMORPHONE HCL 1 MG/ML IJ SOLN
0.2500 mg | INTRAMUSCULAR | Status: DC | PRN
  Administered 2019-09-02 (×2): 0.5 mg via INTRAVENOUS

## 2019-09-02 MED ORDER — PROPOFOL 500 MG/50ML IV EMUL
INTRAVENOUS | Status: DC | PRN
  Administered 2019-09-02: 75 ug/kg/min via INTRAVENOUS

## 2019-09-02 MED ORDER — ASPIRIN 81 MG PO CHEW
81.0000 mg | CHEWABLE_TABLET | Freq: Two times a day (BID) | ORAL | Status: DC
  Administered 2019-09-02 – 2019-09-03 (×2): 81 mg via ORAL
  Filled 2019-09-02 (×2): qty 1

## 2019-09-02 MED ORDER — VITAMIN D 25 MCG (1000 UNIT) PO TABS
5000.0000 [IU] | ORAL_TABLET | Freq: Every day | ORAL | Status: DC
  Administered 2019-09-02: 5000 [IU] via ORAL
  Filled 2019-09-02: qty 5

## 2019-09-02 MED ORDER — LEVOCETIRIZINE DIHYDROCHLORIDE 5 MG PO TABS: 5.0000 mg | ORAL_TABLET | Freq: Every day | ORAL | Status: DC

## 2019-09-02 MED ORDER — HYDROMORPHONE HCL 1 MG/ML IJ SOLN
INTRAMUSCULAR | Status: AC
  Filled 2019-09-02: qty 1

## 2019-09-02 MED ORDER — MIDAZOLAM HCL 5 MG/5ML IJ SOLN
INTRAMUSCULAR | Status: DC | PRN
  Administered 2019-09-02: 2 mg via INTRAVENOUS

## 2019-09-02 MED ORDER — ALPRAZOLAM 0.5 MG PO TABS: 0.5000 mg | ORAL_TABLET | Freq: Every day | ORAL | Status: DC | PRN

## 2019-09-02 MED ORDER — OXYCODONE HCL 5 MG/5ML PO SOLN: 5.0000 mg | Freq: Once | ORAL | Status: DC | PRN

## 2019-09-02 MED ORDER — CLINDAMYCIN PHOSPHATE 900 MG/50ML IV SOLN
900.0000 mg | INTRAVENOUS | Status: AC
  Administered 2019-09-02: 900 mg via INTRAVENOUS
  Filled 2019-09-02: qty 50

## 2019-09-02 SURGICAL SUPPLY — 46 items
APL SKNCLS STERI-STRIP NONHPOA (GAUZE/BANDAGES/DRESSINGS)
BAG SPEC THK2 15X12 ZIP CLS (MISCELLANEOUS)
BAG ZIPLOCK 12X15 (MISCELLANEOUS) IMPLANT
BALL HIP ARTICU EZE 36 8.5 (Hips) IMPLANT
BENZOIN TINCTURE PRP APPL 2/3 (GAUZE/BANDAGES/DRESSINGS) IMPLANT
BLADE SAW SGTL 18X1.27X75 (BLADE) ×2 IMPLANT
CONT SPEC 4OZ CLIKSEAL STRL BL (MISCELLANEOUS) ×1 IMPLANT
COVER PERINEAL POST (MISCELLANEOUS) ×2 IMPLANT
COVER SURGICAL LIGHT HANDLE (MISCELLANEOUS) ×2 IMPLANT
COVER WAND RF STERILE (DRAPES) ×2 IMPLANT
DRAPE ORTHO SPLIT 77X108 STRL (DRAPES) ×2
DRAPE STERI IOBAN 125X83 (DRAPES) ×2 IMPLANT
DRAPE SURG ORHT 6 SPLT 77X108 (DRAPES) IMPLANT
DRAPE U-SHAPE 47X51 STRL (DRAPES) ×5 IMPLANT
DRSG AQUACEL AG ADV 3.5X10 (GAUZE/BANDAGES/DRESSINGS) ×2 IMPLANT
DURAPREP 26ML APPLICATOR (WOUND CARE) ×2 IMPLANT
ELECT REM PT RETURN 15FT ADLT (MISCELLANEOUS) ×2 IMPLANT
GAUZE XEROFORM 1X8 LF (GAUZE/BANDAGES/DRESSINGS) ×2 IMPLANT
GLOVE BIO SURGEON STRL SZ7.5 (GLOVE) ×2 IMPLANT
GLOVE BIOGEL PI IND STRL 8 (GLOVE) ×2 IMPLANT
GLOVE BIOGEL PI INDICATOR 8 (GLOVE) ×2
GLOVE ECLIPSE 8.0 STRL XLNG CF (GLOVE) ×2 IMPLANT
GOWN STRL REUS W/TWL XL LVL3 (GOWN DISPOSABLE) ×4 IMPLANT
HANDPIECE INTERPULSE COAX TIP (DISPOSABLE) ×2
HIP BALL ARTICU EZE 36 8.5 (Hips) ×2 IMPLANT
HOLDER FOLEY CATH W/STRAP (MISCELLANEOUS) ×2 IMPLANT
KIT TURNOVER KIT A (KITS) IMPLANT
LINER ACETAB NEUTRAL 36ID 520D (Liner) ×1 IMPLANT
PACK ANTERIOR HIP CUSTOM (KITS) ×2 IMPLANT
PENCIL SMOKE EVACUATOR (MISCELLANEOUS) ×1 IMPLANT
PIN SECTOR W/GRIP ACE CUP 52MM (Hips) ×1 IMPLANT
SET HNDPC FAN SPRY TIP SCT (DISPOSABLE) ×1 IMPLANT
SLEEVE SUCTION 125 (MISCELLANEOUS) ×1 IMPLANT
STAPLER VISISTAT 35W (STAPLE) ×1 IMPLANT
STEM FEMORAL SZ5 HIGH ACTIS (Stem) ×1 IMPLANT
STRIP CLOSURE SKIN 1/2X4 (GAUZE/BANDAGES/DRESSINGS) IMPLANT
SUT ETHIBOND NAB CT1 #1 30IN (SUTURE) ×2 IMPLANT
SUT ETHILON 2 0 PS N (SUTURE) IMPLANT
SUT MNCRL AB 4-0 PS2 18 (SUTURE) IMPLANT
SUT VIC AB 0 CT1 36 (SUTURE) ×2 IMPLANT
SUT VIC AB 1 CT1 36 (SUTURE) ×2 IMPLANT
SUT VIC AB 2-0 CT1 27 (SUTURE) ×4
SUT VIC AB 2-0 CT1 TAPERPNT 27 (SUTURE) ×2 IMPLANT
TRAY FOLEY MTR SLVR 14FR STAT (SET/KITS/TRAYS/PACK) ×1 IMPLANT
TRAY FOLEY MTR SLVR 16FR STAT (SET/KITS/TRAYS/PACK) IMPLANT
YANKAUER SUCT BULB TIP 10FT TU (MISCELLANEOUS) ×2 IMPLANT

## 2019-09-02 NOTE — Brief Op Note (Signed)
09/02/2019  8:22 AM  PATIENT:  Cynthia Li  63 y.o. female  PRE-OPERATIVE DIAGNOSIS:  osteoarthritis right hip  POST-OPERATIVE DIAGNOSIS:  osteoarthritis right hip  PROCEDURE:  Procedure(s): RIGHT TOTAL HIP ARTHROPLASTY ANTERIOR APPROACH (Right)  SURGEON:  Surgeon(s) and Role:    Mcarthur Rossetti, MD - Primary  PHYSICIAN ASSISTANT: Benita Stabile, PA-C  ANESTHESIA:   spinal  EBL:  100 mL   COUNTS:  YES  DICTATION: .Other Dictation: Dictation Number [id number]  PLAN OF CARE: Admit for overnight observation  PATIENT DISPOSITION:  PACU - hemodynamically stable.   Delay start of Pharmacological VTE agent (>24hrs) due to surgical blood loss or risk of bleeding: no

## 2019-09-02 NOTE — Anesthesia Procedure Notes (Signed)
Procedure Name: MAC Date/Time: 09/02/2019 7:12 AM Performed by: Lissa Morales, CRNA Pre-anesthesia Checklist: Patient identified, Emergency Drugs available, Suction available, Patient being monitored and Timeout performed Patient Re-evaluated:Patient Re-evaluated prior to induction Oxygen Delivery Method: Simple face mask Placement Confirmation: positive ETCO2

## 2019-09-02 NOTE — Progress Notes (Signed)
Dr Doroteo Glassman aware of pt c/o reflux.  ordered received to give tums 800 x1

## 2019-09-02 NOTE — Evaluation (Signed)
Physical Therapy Evaluation Patient Details Name: Cynthia Li MRN: [id number] DOB: [birth year] Today's Date: 09/02/2019   History of Present Illness  Patient is 63 y.o. female s/p Rt THA anterior approach on 09/02/19 with PMH signifcant for GERD, IBS, OA, back suegery, knees surgery, and Lt THA on 07/01/19.    Clinical Impression  Cynthia Li is a 63 y.o. female POD 0 s/p Rt THA anterior approach. Patient reports modified independence with RW for mobility at baseline due to hip pain. Patient is now limited by functional impairments (see PT problem list below) and requires min assist/guard for transfers and gait with RW. Patient was able to ambulate ~75 feet with RW and min assist with cues for safe hand placement. Patient instructed in exercise to facilitate ROM and circulation. Patient will benefit from continued skilled PT interventions to address impairments and progress towards PLOF. Acute PT will follow to progress mobility and stair training in preparation for safe discharge home.    Follow Up Recommendations Follow surgeon's recommendation for DC plan and follow-up therapies    Equipment Recommendations  None recommended by PT    Recommendations for Other Services       Precautions / Restrictions Precautions Precautions: Fall Restrictions Weight Bearing Restrictions: No      Mobility  Bed Mobility Overal bed mobility: Needs Assistance Bed Mobility: Supine to Sit     Supine to sit: Min assist;HOB elevated     General bed mobility comments: cues for use of bed rails and for sequencing, assist to initiate press up and raise trunk upright, assist to pivot LE's to EOB  Transfers Overall transfer level: Needs assistance Equipment used: Rolling walker (2 wheeled) Transfers: Sit to/from Stand Sit to Stand: Min assist;Min guard;From elevated surface         General transfer comment: pt demonstrated good hand placement and technique with RW, light assist required  to complete power up to rise  Ambulation/Gait Ambulation/Gait assistance: Min assist;Min guard Gait Distance (Feet): 75 Feet Assistive device: Rolling walker (2 wheeled) Gait Pattern/deviations: Decreased stride length;Decreased weight shift to right;Decreased stance time - right Gait velocity: decreased   General Gait Details: pt required verbal cues for safe hand placement on RW, no overt LOB noted, pt with good step sequencing and maintained safe proximity to RW throughout.  Stairs            Wheelchair Mobility    Modified Rankin (Stroke Patients Only)       Balance Overall balance assessment: Needs assistance Sitting-balance support: Feet supported Sitting balance-Leahy Scale: Good     Standing balance support: Bilateral upper extremity supported;During functional activity Standing balance-Leahy Scale: Fair              Pertinent Vitals/Pain Pain Assessment: Faces Faces Pain Scale: Hurts little more Pain Location: Rt hip and knee Pain Descriptors / Indicators: Aching;Sore Pain Intervention(s): Limited activity within patient's tolerance;Monitored during session;Repositioned    Home Living Family/patient expects to be discharged to:: Private residence Living Arrangements: Alone Available Help at Discharge: Friend(s);Available 24 hours/day Type of Home: House Home Access: Stairs to enter;Ramped entrance Entrance Stairs-Rails: None Entrance Stairs-Number of Steps: Pt's house has 2 platform steps, and her friends house has 1 (friends house also has a ramp) Home Layout: One level Home Equipment: Environmental consultant - 4 wheels;Bedside commode;Cane - single point;Walker - 2 wheels;Shower seat;Grab bars - tub/shower Additional Comments: pt lives in a "tiny home" and when she returns there she may not be able to fit a  RW through the doorway of her bathroom    Prior Function Level of Independence: Independent with assistive device(s)         Comments: pt using SPC and  rollator prior to surgery     Hand Dominance   Dominant Hand: Right    Extremity/Trunk Assessment   Upper Extremity Assessment Upper Extremity Assessment: Overall WFL for tasks assessed    Lower Extremity Assessment Lower Extremity Assessment: Overall WFL for tasks assessed;RLE deficits/detail RLE Deficits / Details: good quad activation with quad set and LAQ, 4/5 for quad strength with MMT RLE Sensation: WNL RLE Coordination: WNL    Cervical / Trunk Assessment Cervical / Trunk Assessment: Normal  Communication   Communication: No difficulties  Cognition Arousal/Alertness: Awake/alert Behavior During Therapy: WFL for tasks assessed/performed Overall Cognitive Status: Within Functional Limits for tasks assessed               General Comments      Exercises Total Joint Exercises Ankle Circles/Pumps: AROM;10 reps;Seated;Both Quad Sets: AROM;Seated;Right;20 reps Heel Slides: AAROM;10 reps;Seated;Right   Assessment/Plan    PT Assessment Patient needs continued PT services  PT Problem List Decreased strength;Decreased balance;Decreased mobility;Decreased range of motion;Decreased activity tolerance;Decreased knowledge of use of DME       PT Treatment Interventions DME instruction;Functional mobility training;Balance training;Patient/family education;Therapeutic activities;Gait training;Stair training;Therapeutic exercise    PT Goals (Current goals can be found in the Care Plan section)  Acute Rehab PT Goals Patient Stated Goal: to get back home and walk without pain PT Goal Formulation: With patient Time For Goal Achievement: 09/09/19 Potential to Achieve Goals: Good    Frequency 7X/week    AM-PAC PT "6 Clicks" Mobility  Outcome Measure Help needed turning from your back to your side while in a flat bed without using bedrails?: A Little Help needed moving from lying on your back to sitting on the side of a flat bed without using bedrails?: A Little Help  needed moving to and from a bed to a chair (including a wheelchair)?: A Little Help needed standing up from a chair using your arms (e.g., wheelchair or bedside chair)?: A Little Help needed to walk in hospital room?: A Little Help needed climbing 3-5 steps with a railing? : A Little 6 Click Score: 18    End of Session Equipment Utilized During Treatment: Gait belt Activity Tolerance: Patient tolerated treatment well Patient left: in chair;with call bell/phone within reach;with family/visitor present;with chair alarm set Nurse Communication: Mobility status PT Visit Diagnosis: Muscle weakness (generalized) (M62.81);Difficulty in walking, not elsewhere classified (R26.2)    Time: 1610-9604 PT Time Calculation (min) (ACUTE ONLY): 30 min   Charges:   PT Evaluation $PT Eval Low Complexity: 1 Low PT Treatments $Therapeutic Exercise: 8-22 mins        Valentino Saxon, PT, DPT Physical Therapist with [facility]  09/02/2019 1:12 PM

## 2019-09-02 NOTE — Transfer of Care (Signed)
Immediate Anesthesia Transfer of Care Note  Patient: Cynthia Li  Procedure(s) Performed: RIGHT TOTAL HIP ARTHROPLASTY ANTERIOR APPROACH (Right Hip)  Patient Location: PACU  Anesthesia Type:Spinal  Level of Consciousness: awake, alert , oriented and patient cooperative  Airway & Oxygen Therapy: Patient Spontanous Breathing and Patient connected to face mask oxygen  Post-op Assessment: Report given to RN and Post -op Vital signs reviewed and stable  Post vital signs: stable  Last Vitals:  Vitals Value Taken Time  BP 134/62 09/02/19 0848  Temp    Pulse 51 09/02/19 0852  Resp 13 09/02/19 0852  SpO2 100 % 09/02/19 0852  Vitals shown include unvalidated device data.  Last Pain:  Vitals:   09/02/19 0845  TempSrc:   PainSc: (P) 0-No pain         Complications: No apparent anesthesia complications

## 2019-09-02 NOTE — H&P (Signed)
The patient is seen and examined. There has been no acute changes in her medical status. The risk of benefits of surgery have been discussed. She understands we are proceeding to the operating room today for a right total hip arthroplasty. Please see current H&P. There has been no change in this. All questions concerns were answered and addressed. Informed consent was obtained and the right operative hip was marked.

## 2019-09-02 NOTE — Plan of Care (Signed)
  Problem: Education: Goal: Knowledge of General Education information will improve Description: Including pain rating scale, medication(s)/side effects and non-pharmacologic comfort measures Outcome: Progressing   Problem: Clinical Measurements: Goal: Will remain free from infection Outcome: Progressing Goal: Respiratory complications will improve Outcome: Progressing Goal: Cardiovascular complication will be avoided Outcome: Progressing   Problem: Pain Managment: Goal: General experience of comfort will improve Outcome: Progressing   Problem: Safety: Goal: Ability to remain free from injury will improve Outcome: Progressing   Problem: Education: Goal: Knowledge of the prescribed therapeutic regimen will improve Outcome: Progressing Goal: Understanding of discharge needs will improve Outcome: Progressing   

## 2019-09-02 NOTE — Op Note (Signed)
NAME: Cynthia Li, Cynthia Li MEDICAL RECORD [id number] ACCOUNT [id number] DATE OF BIRTH:[birth year] FACILITY: [facility] LOCATION: [facility] PHYSICIAN:Gisela Lea Kerry Fort, MD  OPERATIVE REPORT  DATE OF PROCEDURE:  09/02/2019  PREOPERATIVE DIAGNOSIS:  Severe end-stage arthritis and degenerative joint disease, right hip.  POSTOPERATIVE DIAGNOSIS:  Severe end-stage arthritis and degenerative joint disease, right hip.  PROCEDURE:  Right total hip arthroplasty through direct anterior approach.  IMPLANTS:  DePuy Sector Gription acetabular component size 52, size 36+0 neutral polyethylene liner, size 5 Actis femoral component with high offset, size 36+8.5 metal hip ball.  SURGEON:  Lind Guest. Ninfa Linden, MD  ASSISTANT:  Erskine Emery, PA-C  ANESTHESIA:  Spinal.  ANTIBIOTICS:  900 mg IV clindamycin.  ESTIMATED BLOOD LOSS:  100 mL.  COMPLICATIONS:  None.  INDICATIONS:  The patient is a very pleasant 63 year old female with debilitating arthritis of both her hips.  In just October of this year, she underwent a successful left total hip arthroplasty.  Her right hip pain is severe and her x-rays do show  end-stage arthritis.  Given the success of her left total hip arthroplasty, she wishes to proceed with this on the right side and I agree based on her clinical exam findings and radiographic findings.  Her pain is daily and is detrimentally affecting her  mobility, her quality of life and activities of daily living.  Having had this done before, she is fully aware of the risk of acute blood loss anemia, nerve and vessel injury, fracture, infection, dislocation, DVT and implant failure.  She understands  our goals are to decrease pain, improve mobility and overall improve quality of life.  DESCRIPTION OF PROCEDURE:  After informed consent was obtained and appropriate right hip was marked, she was brought to the operating room and sat up on a stretcher where spinal anesthesia was obtained.  She was  then laid in supine position on a  stretcher.  Foley catheter was placed and had traction boots were placed on both her feet.  Of note, radiographically and clinically she is shorter on her right side than the left.  We then placed her supine on the Hana fracture table, the perineal post  in place and both legs in line skeletal traction device and no traction applied.  Her right operative hip was prepped and draped with DuraPrep and sterile drapes.  A timeout was called to identify correct patient and correct right hip.  I then made an  incision just inferior and posterior to the anterior superior iliac spine and carried this obliquely down the leg.  Dissected down tensor fascia lata muscle.  Tensor fascia was then divided longitudinally to proceed with direct anterior approach to the  hip.  We identified and cauterized circumflex vessels.  I then identified the hip capsule, opened the hip capsule in an L-type format, finding moderate joint effusion and significant disease around the right hip in terms of periarticular osteophytes and  sclerotic changes.  I placed Cobra retractors around the medial and lateral femoral neck and made our femoral neck cut with an oscillating saw just proximal to the lesser trochanter and completed this with an osteotome.  We placed a corkscrew guide in  the femoral head and removed the femoral heads entirety and found a wide area devoid of cartilage.  I then placed a bent Hohmann over the medial acetabular rim and removed remnants of the acetabular labrum and other debris and began reaming from a small  reamer in stepwise increments going from a 43 up to a  size 51 with all reamers under direct visualization, the last reamer under direct fluoroscopy, so we could obtain our depth of reaming, our inclination and anteversion.  I then placed the real DePuy  Sector Gription acetabular component size 52 and a 36+0 neutral polyethylene liner.  Attention was then turned to the femur.   With the leg externally rotated to 120 degrees, extended and adducted, we were placing Mueller retractor medially and Hohman  retractor behind the greater trochanter, released lateral joint capsule and used a box-cutting osteotome to enter the femoral canal and a rongeur to lateralize.  We then began broaching using the Actis broaching system from a size zero going up to a size  5.  With the size 5 in place based on her anatomy, we trialed a standard offset femoral neck and a 36+1.5 hip ball, reduced this in the acetabulum, which was quite easy.  We knew that we need to do more offset and leg length.  We dislocated the hip and  removed the trial components.  I then placed the real high offset femoral component, size 5, which is the Actis and went with a 36+8.5 hip ball, reduced this in the acetabulum.  We were pleased with the tightness of it as well as range of motion and  stability assessed radiographically and mechanically.  We then irrigated the hip with normal saline solution using pulsatile lavage.  I was able to reapproximate the joint capsule with #1 Ethibond suture.  Number one Vicryl suture was used to close the  tensor fascia, 0 Vicryl was used to close deep tissue, 2-0 Vicryl was used to close subcutaneous tissue and interrupted staples were placed on the skin.  Xeroform and Aquacel dressing was applied.  She was taken off the Hana table and taken to recovery  room in stable condition.  All final counts were correct.  There were no complications noted.  Of note, Rexene Edison, PA-C, assisted in the entire case.  His assistance was crucial for facilitating all aspects of this case.  TN/NUANCE  D:09/02/2019 T:09/02/2019 JOB:[id number]

## 2019-09-02 NOTE — Anesthesia Procedure Notes (Signed)
Spinal  Patient location during procedure: OR Start time: 09/02/2019 7:10 AM End time: 09/02/2019 7:54 AM Staffing Performed: anesthesiologist  Anesthesiologist: Pervis Hocking, DO Preanesthetic Checklist Completed: patient identified, IV checked, risks and benefits discussed, surgical consent, monitors and equipment checked, pre-op evaluation and timeout performed Spinal Block Patient position: left lateral decubitus Prep: DuraPrep and site prepped and draped Patient monitoring: cardiac monitor, continuous pulse ox and blood pressure Approach: midline Location: L3-4 Injection technique: single-shot Needle Needle type: Pencan  Needle gauge: 24 G Needle length: 9 cm Assessment Sensory level: T6 Additional Notes Functioning IV was confirmed and monitors were applied. Sterile prep and drape, including hand hygiene and sterile gloves were used. The patient was positioned and the spine was prepped. The skin was anesthetized with lidocaine.  Free flow of clear CSF was obtained prior to injecting local anesthetic into the CSF.  The spinal needle aspirated freely following injection.  The needle was carefully withdrawn.  The patient tolerated the procedure well.

## 2019-09-02 NOTE — Anesthesia Postprocedure Evaluation (Signed)
Anesthesia Post Note  Patient: Cynthia Li  Procedure(s) Performed: RIGHT TOTAL HIP ARTHROPLASTY ANTERIOR APPROACH (Right Hip)     Patient location during evaluation: PACU Anesthesia Type: Spinal Level of consciousness: oriented and awake and alert Pain management: pain level controlled Vital Signs Assessment: post-procedure vital signs reviewed and stable Respiratory status: spontaneous breathing, respiratory function stable and patient connected to nasal cannula oxygen Cardiovascular status: blood pressure returned to baseline and stable Postop Assessment: no headache, no backache and no apparent nausea or vomiting Anesthetic complications: no    Last Vitals:  Vitals:   09/02/19 1015 09/02/19 1030  BP: 106/64 126/75  Pulse: 62 62  Resp: 12 15  Temp:  36.5 C  SpO2: 98% 100%    Last Pain:  Vitals:   09/02/19 1030  TempSrc: Oral  PainSc: [name]

## 2019-09-02 NOTE — TOC Progression Note (Addendum)
Transition of Care [facility]) - Progression Note    Patient Details  Name: Cynthia Li MRN: [id number] Date of Birth: [birth year]  Transition of Care [facility]) CM/SW [place], [place] Phone Number: 09/02/2019, 4:18 PM  Clinical Narrative:    Therapy [facility] Has DME      Barriers to Discharge: (No barriers)  Expected Discharge Plan and Services                               HH Arranged: PT [place] Agency: [facility] ([facility] [employer]) Date [place]: 09/02/19   Representative spoke with at [place]: [place] ([place]) Interventions    Readmission Risk Interventions No flowsheet data found.

## 2019-09-03 DIAGNOSIS — M1611 Unilateral primary osteoarthritis, right hip: Secondary | ICD-10-CM | POA: Diagnosis not present

## 2019-09-03 LAB — URINE CULTURE: Culture: NO GROWTH

## 2019-09-03 LAB — BASIC METABOLIC PANEL
Anion gap: 8 (ref 5–15)
BUN: 14 mg/dL (ref 8–23)
CO2: 24 mmol/L (ref 22–32)
Calcium: 9.6 mg/dL (ref 8.9–10.3)
Chloride: 108 mmol/L (ref 98–111)
Creatinine, Ser: 0.6 mg/dL (ref 0.44–1.00)
GFR calc Af Amer: >60 mL/min (ref >60)
GFR calc non Af Amer: >60 mL/min (ref >60)
Glucose, Bld: 130 mg/dL — ABNORMAL HIGH (ref 70–99)
Potassium: 4 mmol/L (ref 3.5–5.1)
Sodium: 140 mmol/L (ref 135–145)

## 2019-09-03 LAB — CBC
HCT: 33.5 % — ABNORMAL LOW (ref 36.0–46.0)
Hemoglobin: 10.4 g/dL — ABNORMAL LOW (ref 12.0–15.0)
MCH: 28.7 pg (ref 26.0–34.0)
MCHC: 31 g/dL (ref 30.0–36.0)
MCV: 92.5 fL (ref 80.0–100.0)
Platelets: 221 10*3/uL (ref 150–400)
RBC: 3.62 MIL/uL — ABNORMAL LOW (ref 3.87–5.11)
RDW: 12.8 % (ref 11.5–15.5)
WBC: 9.1 10*3/uL (ref 4.0–10.5)
nRBC: 0 % (ref 0.0–0.2)

## 2019-09-03 MED ORDER — OXYCODONE HCL 5 MG PO TABS: 5.0000 mg | ORAL_TABLET | ORAL | 0 refills | Status: DC | PRN

## 2019-09-03 MED ORDER — METHOCARBAMOL 500 MG PO TABS: 500.0000 mg | ORAL_TABLET | Freq: Four times a day (QID) | ORAL | 1 refills | Status: DC | PRN

## 2019-09-03 MED ORDER — ASPIRIN 81 MG PO CHEW: 81.0000 mg | CHEWABLE_TABLET | Freq: Two times a day (BID) | ORAL | 1 refills | Status: DC

## 2019-09-03 NOTE — TOC Progression Note (Signed)
Transition of Care [facility]) - Progression Note    Patient Details  Name: Cynthia Li MRN: [id number] Date of Birth: 12/21/1955  Transition of Care [facility]) CM/SW Contact  Joaquin Courts, RN Phone Number: 09/03/2019, 12:26 PM  Clinical Narrative:    CM spoke with patient at bedside. Patient set up with [facility] for [place]. Reports has all DME.     Expected Discharge Plan: [place] Barriers to Discharge: No Barriers Identified  Expected Discharge Plan and Services Expected Discharge Plan: [place]   Discharge Planning Services: CM Consult Post Acute Care Choice: [place] arrangements for the past 2 months: Single Family Home Expected Discharge Date: 09/03/19               DME Arranged: Gilford Rile rolling DME Agency: [employer] Date DME Agency Contacted: 09/02/19   Representative spoke with at DME Agency: Ovid Curd HH Arranged: PT [place]: [facility] [employer] Date [place]: 09/02/19   Representative spoke with at [place]: [place] ([place]) Interventions    Readmission Risk Interventions No flowsheet data found.

## 2019-09-03 NOTE — Discharge Instructions (Signed)

## 2019-09-03 NOTE — Discharge Summary (Signed)
Patient ID: Cynthia Li MRN: [id number] DOB/AGE: 65-Nov-1957 65 y.o.  Admit date: 09/02/2019 Discharge date: 09/03/2019  Admission Diagnoses:  Principal Problem:   Unilateral primary osteoarthritis, right hip Active Problems:   Status post total replacement of right hip   Discharge Diagnoses:  Same  Past Medical History:  Diagnosis Date  . Arthritis   . GERD (gastroesophageal reflux disease)   . Hiatal hernia   . History of kidney stones   . IBS (irritable bowel syndrome)    age 63  . Kidney stone 3/14  . Lumbar disc disease   . Panic disorder   . Pinched nerve    some DDD    Surgeries: Procedure(s): RIGHT TOTAL HIP ARTHROPLASTY ANTERIOR APPROACH on 09/02/2019   Consultants:   Discharged Condition: Improved  Hospital Course: Cynthia Li is an 63 y.o. female who was admitted 09/02/2019 for operative treatment ofUnilateral primary osteoarthritis, right hip. Patient has severe unremitting pain that affects sleep, daily activities, and work/hobbies. After pre-op clearance the patient was taken to the operating room on 09/02/2019 and underwent  Procedure(s): RIGHT TOTAL HIP ARTHROPLASTY ANTERIOR APPROACH.    Patient was given perioperative antibiotics:  Anti-infectives (From admission, onward)   Start     Dose/Rate Route Frequency Ordered Stop   09/02/19 1300  clindamycin (CLEOCIN) IVPB 600 mg     600 mg 100 mL/hr over 30 Minutes Intravenous Every 6 hours 09/02/19 1030 09/02/19 2013   09/02/19 0658  clindamycin (CLEOCIN) 900 MG/50ML IVPB    Note to Pharmacy: Anastasio Champion   : cabinet override      09/02/19 0658 09/02/19 1914   09/02/19 0600  clindamycin (CLEOCIN) IVPB 900 mg     900 mg 100 mL/hr over 30 Minutes Intravenous On call to O.R. 09/02/19 0547 09/02/19 0714       Patient was given sequential compression devices, early ambulation, and chemoprophylaxis to prevent DVT.  Patient benefited maximally from hospital stay and there were no complications.     Recent vital signs:  Patient Vitals for the past 24 hrs:  BP Temp Temp src Pulse Resp SpO2 Height Weight  09/03/19 1019 114/65 98.5 F (36.9 C) Oral 63 16 99 % - -  09/03/19 0610 120/75 98 F (36.7 C) Oral (!) 55 16 99 % - -  09/03/19 0213 112/67 98.1 F (36.7 C) Oral 69 16 99 % - -  09/02/19 2120 105/62 98.2 F (36.8 C) Oral 80 16 95 % - -  09/02/19 1352 117/71 97.9 F (36.6 C) Oral 75 14 96 % - -  09/02/19 1229 120/81 98.3 F (36.8 C) Oral 81 14 100 % - -  09/02/19 1131 123/72 98.2 F (36.8 C) Oral 72 15 100 % - -  09/02/19 1031 - - - - - - 5\' 5"  (1.651 m) 99.8 kg  09/02/19 1030 126/75 97.7 F (36.5 C) Oral 62 15 100 % - -     Recent laboratory studies:  Recent Labs    09/03/19 0220  WBC 9.1  HGB 10.4*  HCT 33.5*  PLT 221  NA 140  K 4.0  CL 108  CO2 24  BUN 14  CREATININE 0.60  GLUCOSE 130*  CALCIUM 9.6     Discharge Medications:    Diagnostic Studies: DG Pelvis Portable  Result Date: 09/02/2019 CLINICAL DATA:  Total right hip replacement EXAM: PORTABLE PELVIS 1-2 VIEWS COMPARISON:  None. FINDINGS: Patient is status post right hip replacement. Acetabular and femoral components are in good  position. The patient is also status post left hip replacement. Acetabular and femoral components are in good position. No other acute abnormalities identified. IMPRESSION: Bilateral hip replacements as above. Electronically Signed   By: Dorise Bullion III M.D   On: 09/02/2019 09:17   DG C-Arm 1-60 Min-No Report  Result Date: 09/02/2019 Fluoroscopy was utilized by the requesting physician.  No radiographic interpretation.   DG HIP OPERATIVE UNILAT W OR W/O PELVIS RIGHT  Result Date: 09/02/2019 CLINICAL DATA:  Right hip replacement. FLUOROSCOPY TIME:  14 seconds EXAM: OPERATIVE RIGHT HIP (WITH PELVIS IF PERFORMED) 4 VIEWS TECHNIQUE: Fluoroscopic spot image(s) were submitted for interpretation post-operatively. COMPARISON:  None. FINDINGS: The patient is status post right  hip replacement by the end of the study. Acetabular and femoral components are in good position. IMPRESSION: Right hip replacement as above. Electronically Signed   By: Dorise Bullion III M.D   On: 09/02/2019 09:17    Disposition: Discharge disposition: 01-Home or [place]    Mcarthur Rossetti, MD Follow up in 2 week(s).   Specialty: Orthopedic Surgery Contact information: [address] [place] [address] [phone]            Signed: Mcarthur Rossetti 09/03/2019, 10:23 AM

## 2019-09-03 NOTE — Progress Notes (Signed)
Subjective: 1 Day Post-Op Procedure(s) (LRB): RIGHT TOTAL HIP ARTHROPLASTY ANTERIOR APPROACH (Right) Patient reports pain as mild.    Objective: Vital signs in last 24 hours: Temp:  [97.7 F (36.5 C)-98.5 F (36.9 C)] 98.5 F (36.9 C) (12/12 1019) Pulse Rate:  [55-81] 63 (12/12 1019) Resp:  [14-16] 16 (12/12 1019) BP: (105-126)/(62-81) 114/65 (12/12 1019) SpO2:  [95 %-100 %] 99 % (12/12 1019) Weight:  [99.8 kg] 99.8 kg (12/11 1031)  Intake/Output from previous day: 12/11 0701 - 12/12 0700 In: 3320.2 [P.O.:700; I.V.:2370.2; IV Piggyback:250] Out: 1125 [Urine:1025; Blood:100] Intake/Output this shift: Total I/O In: 240 [P.O.:240] Out: -   Recent Labs    09/03/19 0220  HGB 10.4*   Recent Labs    09/03/19 0220  WBC 9.1  RBC 3.62*  HCT 33.5*  PLT 221   Recent Labs    09/03/19 0220  NA 140  K 4.0  CL 108  CO2 24  BUN 14  CREATININE 0.60  GLUCOSE 130*  CALCIUM 9.6   No results for input(s): LABPT, INR in the last 72 hours.  Right lower extremity: Incision: dressing C/D/I Compartment soft   Assessment/Plan: 1 Day Post-Op Procedure(s) (LRB): RIGHT TOTAL HIP ARTHROPLASTY ANTERIOR APPROACH (Right) Up with therapy Discharge home with home health     Erskine Emery 09/03/2019, 10:24 AM

## 2019-09-03 NOTE — Progress Notes (Signed)
Physical Therapy Treatment Patient Details Name: Cynthia Li MRN: [id number] DOB: 1956/03/17 Today's Date: 09/03/2019    History of Present Illness Patient is 63 y.o. female s/p Rt THA anterior approach on 09/02/19 with PMH signifcant for GERD, IBS, OA, back suegery, knees surgery, and Lt THA on 07/01/19.    PT Comments    Pt progressing well with mobility and therex program.  Pt with good carry over from L THR ~6 wks ago and eager for dc home this date.  Follow Up Recommendations  Follow surgeon's recommendation for DC plan and follow-up therapies     Equipment Recommendations  None recommended by PT    Recommendations for Other Services OT consult     Precautions / Restrictions Precautions Precautions: Fall Restrictions Weight Bearing Restrictions: No    Mobility  Bed Mobility               General bed mobility comments: Pt up in chair and requests back to same  Transfers Overall transfer level: Needs assistance Equipment used: Rolling walker (2 wheeled) Transfers: Sit to/from Stand Sit to Stand: Min guard;Supervision         General transfer comment: pt demonstrated good hand placement and technique with RW, light assist required to complete power up to rise  Ambulation/Gait Ambulation/Gait assistance: Min guard;Supervision Gait Distance (Feet): 250 Feet Assistive device: Rolling walker (2 wheeled) Gait Pattern/deviations: Decreased stride length;Decreased weight shift to right;Decreased stance time - right;Antalgic;Shuffle Gait velocity: decreased   General Gait Details: cues for position from RW   Stairs Stairs: (Pt declines to attempt stairs - has ramp at friends home)           Wheelchair Mobility    Modified Rankin (Stroke Patients Only)       Balance Overall balance assessment: Needs assistance Sitting-balance support: Feet supported Sitting balance-Leahy Scale: Good     Standing balance support: Bilateral upper extremity  supported;During functional activity Standing balance-Leahy Scale: Fair                              Cognition Arousal/Alertness: Awake/alert Behavior During Therapy: WFL for tasks assessed/performed Overall Cognitive Status: Within Functional Limits for tasks assessed                                        Exercises Total Joint Exercises Ankle Circles/Pumps: AROM;Seated;Both;15 reps Quad Sets: AROM;Seated;Right;10 reps Heel Slides: AAROM;Seated;Right;20 reps Hip ABduction/ADduction: AAROM;Right;15 reps;Supine Long Arc Quad: AROM;Right;10 reps;Seated    General Comments        Pertinent Vitals/Pain Pain Assessment: 0-10 Pain Score: 4  Pain Location: Rt hip and knee Pain Descriptors / Indicators: Aching;Sore Pain Intervention(s): Limited activity within patient's tolerance;Monitored during session;Premedicated before session;Ice applied    Home Living                      Prior Function            PT Goals (current goals can now be found in the care plan section) Acute Rehab PT Goals Patient Stated Goal: to get back home and walk without pain PT Goal Formulation: With patient Time For Goal Achievement: 09/09/19 Potential to Achieve Goals: Good Progress towards PT goals: Progressing toward goals    Frequency    7X/week      PT Plan Current plan remains appropriate  Co-evaluation              AM-PAC PT "6 Clicks" Mobility   Outcome Measure  Help needed turning from your back to your side while in a flat bed without using bedrails?: A Little Help needed moving from lying on your back to sitting on the side of a flat bed without using bedrails?: A Little Help needed moving to and from a bed to a chair (including a wheelchair)?: A Little Help needed standing up from a chair using your arms (e.g., wheelchair or bedside chair)?: A Little Help needed to walk in hospital room?: A Little Help needed climbing 3-5 steps  with a railing? : A Little 6 Click Score: 18    End of Session Equipment Utilized During Treatment: Gait belt Activity Tolerance: Patient tolerated treatment well Patient left: in chair;with call bell/phone within reach;with chair alarm set Nurse Communication: Mobility status PT Visit Diagnosis: Muscle weakness (generalized) (M62.81);Difficulty in walking, not elsewhere classified (R26.2)     Time: 1028-1100 PT Time Calculation (min) (ACUTE ONLY): 32 min  Charges:  $Gait Training: 8-22 mins $Therapeutic Exercise: 8-22 mins                     Mauro Kaufmann PT Acute Rehabilitation Services Pager [phone] Office [phone]    [facility] 09/03/2019, 12:33 PM

## 2019-09-05 ENCOUNTER — Encounter: Payer: Self-pay | Admitting: *Deleted

## 2019-09-05 ENCOUNTER — Telehealth: Payer: Self-pay | Admitting: Orthopaedic Surgery

## 2019-09-05 NOTE — Telephone Encounter (Signed)
Received call from Verdis Frederickson (PT) with [facility] needing verbal orders for HHPT 3 Wk 1 and twice a week for  2 weeks.  The number to contact Verdis Frederickson is [phone]

## 2019-09-05 NOTE — Telephone Encounter (Signed)
Verbal order given to Jerry  

## 2019-09-06 NOTE — Telephone Encounter (Signed)
Pt just had hip surgery awaiting a call back regarding her to be set up for prolia.  She is able to be set up when she can PA is authorized until 06/30/20

## 2019-09-19 ENCOUNTER — Ambulatory Visit (INDEPENDENT_AMBULATORY_CARE_PROVIDER_SITE_OTHER): Payer: Medicare Other | Admitting: Orthopaedic Surgery

## 2019-09-19 ENCOUNTER — Other Ambulatory Visit: Payer: Self-pay

## 2019-09-19 ENCOUNTER — Encounter: Payer: Self-pay | Admitting: Orthopaedic Surgery

## 2019-09-19 DIAGNOSIS — Z96642 Presence of left artificial hip joint: Secondary | ICD-10-CM

## 2019-09-19 DIAGNOSIS — Z96641 Presence of right artificial hip joint: Secondary | ICD-10-CM

## 2019-09-19 NOTE — Progress Notes (Signed)
The patient comes in today 2 weeks status post a right total hip arthroplasty.  She is also 11 weeks status post a left total hip arthroplasty.  She is disabled with a cane and states she is doing well overall.  She states that she is little more sore after this surgery on her right hip but she has been more active as well.  She is already driving and off of narcotics.  She has been on a baby aspirin twice a day.  On exam her incision looks good.  The staples were removed and Steri-Strips applied.  She does have a moderate seroma but she does not want this aspirated.  Her leg lengths are equal.  She can try heat about 30 to 45 minutes twice a day around the right hip area.  This will help the seroma slowly dissolved.  She can go down to aspirin just once a day for a week and then can stop her aspirin.  All question concerns were answered and addressed.  We will see her back in 4 weeks to see how she is doing overall but no x-rays are needed.

## 2019-10-03 ENCOUNTER — Telehealth: Payer: Self-pay

## 2019-10-03 NOTE — Telephone Encounter (Signed)
Left VM requesting patient call back to let us know if she will be getting Prolia injection-patient is ready to be scheduled for nurse visit- owes $0

## 2019-10-17 ENCOUNTER — Other Ambulatory Visit: Payer: Self-pay

## 2019-10-17 ENCOUNTER — Ambulatory Visit (INDEPENDENT_AMBULATORY_CARE_PROVIDER_SITE_OTHER): Payer: Medicare Other | Admitting: Orthopaedic Surgery

## 2019-10-17 ENCOUNTER — Encounter: Payer: Self-pay | Admitting: Orthopaedic Surgery

## 2019-10-17 DIAGNOSIS — Z96641 Presence of right artificial hip joint: Secondary | ICD-10-CM

## 2019-10-17 DIAGNOSIS — Z96642 Presence of left artificial hip joint: Secondary | ICD-10-CM

## 2019-10-17 NOTE — Progress Notes (Signed)
[name] is now 3 months status post a left total hip arthroplasty.  We replaced her right hip last year as well.  She is doing well overall.  She is only taking Tylenol as needed for pain.  She is doing physical therapy once a week.  She feels like her mobility is improved quite a bit as well.  She said she did blow leaves last week in her yard.  She walks with just a slight limp but overall looks good.  Both hips move smoothly and fluidly and her leg lengths are equal.  At this point all question concerns were answered and addressed.  We can see her back in 6 months from now.  At that visit I like a standing low AP pelvis and lateral of both hips.

## 2019-10-19 ENCOUNTER — Encounter (INDEPENDENT_AMBULATORY_CARE_PROVIDER_SITE_OTHER): Payer: Self-pay | Admitting: Bariatrics

## 2019-10-19 ENCOUNTER — Ambulatory Visit (INDEPENDENT_AMBULATORY_CARE_PROVIDER_SITE_OTHER): Payer: Medicare Other | Admitting: Bariatrics

## 2019-10-19 ENCOUNTER — Other Ambulatory Visit: Payer: Self-pay

## 2019-10-19 VITALS — BP 131/80 | HR 66 | Temp 98.5°F | Ht 65.0 in | Wt 222.0 lb

## 2019-10-19 DIAGNOSIS — Z1331 Encounter for screening for depression: Secondary | ICD-10-CM

## 2019-10-19 DIAGNOSIS — R0602 Shortness of breath: Secondary | ICD-10-CM | POA: Diagnosis not present

## 2019-10-19 DIAGNOSIS — Z6836 Body mass index (BMI) 36.0-36.9, adult: Secondary | ICD-10-CM

## 2019-10-19 DIAGNOSIS — E559 Vitamin D deficiency, unspecified: Secondary | ICD-10-CM

## 2019-10-19 DIAGNOSIS — E6609 Other obesity due to excess calories: Secondary | ICD-10-CM | POA: Insufficient documentation

## 2019-10-19 DIAGNOSIS — R7309 Other abnormal glucose: Secondary | ICD-10-CM | POA: Diagnosis not present

## 2019-10-19 DIAGNOSIS — K219 Gastro-esophageal reflux disease without esophagitis: Secondary | ICD-10-CM

## 2019-10-19 DIAGNOSIS — Z0289 Encounter for other administrative examinations: Secondary | ICD-10-CM

## 2019-10-19 DIAGNOSIS — M160 Bilateral primary osteoarthritis of hip: Secondary | ICD-10-CM

## 2019-10-19 DIAGNOSIS — R5383 Other fatigue: Secondary | ICD-10-CM

## 2019-10-19 DIAGNOSIS — E7849 Other hyperlipidemia: Secondary | ICD-10-CM

## 2019-10-19 NOTE — Progress Notes (Signed)
Dear Dr. Velna Li,   Thank you for referring Cynthia Li to our clinic. The following note includes my evaluation and treatment recommendations.  Chief Complaint:   OBESITY Cynthia Li (MR# [id number]) is a 64 y.o. female who presents for evaluation and treatment of obesity and related comorbidities. Current BMI is Body mass index is 36.94 kg/m.Marland Kitchen Cynthia Li has been struggling with her weight for many years and has been unsuccessful in either losing weight, maintaining weight loss, or reaching her healthy weight goal.  Cynthia Li is currently in the action stage of change and ready to dedicate time achieving and maintaining a healthier weight. Cynthia Li is interested in becoming our patient and working on intensive lifestyle modifications including (but not limited to) diet and exercise for weight loss.  Cynthia does not like to cook as she lives alone and states "no fun to cook for one person." She craves sweets.  Cynthia Li's habits were reviewed today and are as follows: her desired weight loss is 42 lbs, she started gaining weight with pregnancy, her heaviest weight ever was 232 pounds, she craves sweets, she skips breakfast (sleeps late), she frequently makes poor food choices, she frequently eats larger portions than normal and she struggles with emotional eating.  Depression Screen Cynthia Li's Food and Mood (modified PHQ-9) score was 8.  Depression screen PHQ 2/9 10/19/2019  Decreased Interest 1  Down, Depressed, Hopeless 2  PHQ - 2 Score 3  Altered sleeping 0  Tired, decreased energy 1  Change in appetite 1  Feeling bad or failure about yourself  1  Trouble concentrating 2  Moving slowly or fidgety/restless 0  Suicidal thoughts 0  PHQ-9 Score 8  Difficult doing work/chores Somewhat difficult   Subjective:   Other fatigue. Cynthia Li denies daytime somnolence and admits to waking up still tired. Cynthia Li generally gets 6-8 hours of sleep per night, and states that she sometimes  has generally restful sleep. Apneic episodes are not present. Epworth Sleepiness Score is 4.  Shortness of breath on exertion. Cynthia Li notes increasing shortness of breath with exercising and seems to be worsening over time with weight gain. She notes getting out of breath sooner with activity than she used to. This has not gotten worse recently. Cynthia Li denies shortness of breath at rest or orthopnea.  Vitamin D deficiency. Cynthia is taking prescription Vitamin D. Last Vitamin D level was 28.2 on 04/12/2018.  Gastroesophageal reflux disease, unspecified whether esophagitis present. Cynthia is taking Protonix (hiatal hernia). GERD is controlled.  Elevated glucose. Cynthia reports having a normal appetite.  Other hyperlipidemia. Cynthia takes Omega 3 and Niacin.   No results found for: CHOL, HDL, LDLCALC, LDLDIRECT, TRIG, CHOLHDL Lab Results  Component Value Date   ALT 16 04/12/2018   AST 17 04/12/2018   ALKPHOS 100 04/12/2018   BILITOT <0.2 04/12/2018   The ASCVD Risk score Cynthia Bussing DC Jr., et al., 2013) failed to calculate for the following reasons:   Cannot find a previous HDL lab   Cannot find a previous total cholesterol lab  Osteoarthritis of both hips, unspecified osteoarthritis type. Cynthia has had 2 hip replacements. She takes oxycodone.  Depression screening. Cynthia had a mildly positive depression screen with a PHQ-9 score of 8.  Assessment/Plan:   Other fatigue. Cynthia Li does not feel that her weight is causing her energy to be lower than it should be. Fatigue may be related to obesity, depression or many other causes. Labs will be ordered, and in the meanwhile, Cynthia Li will focus  on self care including making healthy food choices, increasing physical activity and focusing on stress reduction.  EKG 12-Lead  Shortness of breath on exertion. Cynthia Li does not feel that she gets out of breath more easily that she used to when she exercises. Cynthia Li's shortness of breath appears to be obesity  related and exercise induced. She has agreed to work on weight loss and gradually increase exercise to treat her exercise induced shortness of breath. Will continue to monitor closely.  Vitamin D deficiency. Low Vitamin D level contributes to fatigue and are associated with obesity, breast, and colon cancer. She agrees to continue to take Vitamin D and will follow-up for routine testing of Vitamin D, at least 2-3 times per year to avoid over-replacement.  Gastroesophageal reflux disease, unspecified whether esophagitis present. Intensive lifestyle modifications are the first line treatment for this issue. We discussed several lifestyle modifications today and she will continue to work on diet, exercise and weight loss efforts. Orders and follow up as documented in patient record. Cynthia will continue Protonix.  Counseling . If a person has gastroesophageal reflux disease (GERD), food and stomach acid move back up into the esophagus and cause symptoms or problems such as damage to the esophagus. . Anti-reflux measures include: raising the head of the bed, avoiding tight clothing or belts, avoiding eating late at night, not lying down shortly after mealtime, and achieving weight loss. . Avoid ASA, NSAID's, caffeine, alcohol, and tobacco.  . OTC Pepcid and/or Tums are often very helpful for as needed use.  Marland Kitchen However, for persisting chronic or daily symptoms, stronger medications like Omeprazole may be needed. . You may need to avoid foods and drinks such as: ? Coffee and tea (with or without caffeine). ? Drinks that contain alcohol. ? Energy drinks and sports drinks. ? Bubbly (carbonated) drinks or sodas. ? Chocolate and cocoa. ? Peppermint and mint flavorings. ? Garlic and onions. ? Horseradish. ? Spicy and acidic foods. These include peppers, chili powder, curry powder, vinegar, hot sauces, and BBQ sauce. ? Citrus fruit juices and citrus fruits, such as oranges, lemons, and limes. ? Tomato-based  foods. These include red sauce, chili, salsa, and pizza with red sauce. ? Fried and fatty foods. These include donuts, french fries, potato chips, and high-fat dressings. ? High-fat meats. These include hot dogs, rib eye steak, sausage, ham, and bacon.  Elevated glucose. Fasting labs will be obtained and results with be discussed with Cynthia Li in 2 weeks at her follow up visit. In the meanwhile Cynthia Li was started on a lower simple carbohydrate diet and will work on weight loss efforts.  Hemoglobin A1c, Insulin, random ordered.  Other hyperlipidemia. Cardiovascular risk and specific lipid/LDL goals reviewed.  We discussed several lifestyle modifications today and Cynthia Li will continue to work on diet, exercise and weight loss efforts. Orders and follow up as documented in patient record. Cynthia will continue OTC medications and will decrease saturated fats.  Counseling Intensive lifestyle modifications are the first line treatment for this issue. . Dietary changes: Increase soluble fiber. Decrease simple carbohydrates. . Exercise changes: Moderate to vigorous-intensity aerobic activity 150 minutes per week if tolerated. . Lipid-lowering medications: see documented in medical record.  Osteoarthritis of both hips, unspecified osteoarthritis type. Cynthia will follow-up with her PCP. She will continue oxycodone.  Depression screening. Cynthia Li had a mildly positive depression screening. Depression is commonly associated with obesity and often results in emotional eating behaviors. We will monitor this closely and work on CBT to help improve the  non-hunger eating patterns. Referral to Psychology may be required if no improvement is seen as she continues in our clinic.  Class 2 severe obesity with serious comorbidity and body mass index (BMI) of 36.0 to 36.9 in adult, unspecified obesity type (HCC).  Cynthia Li is currently in the action stage of change and her goal is to continue with weight loss efforts. I  recommend Cynthia Li begin the structured treatment plan as follows:  She has agreed to the Category 1 Plan.  We independently reviewed labs with her from 09/03/2019 including BMP, CBC, and glucose.  She will work on meal planning.  Exercise goals: She will continue Physical Therapy.  Behavioral modification strategies: increasing lean protein intake, decreasing simple carbohydrates, increasing vegetables, increasing water intake, decreasing eating out, no skipping meals, meal planning and cooking strategies, keeping healthy foods in the home and planning for success.  She was informed of the importance of frequent follow-up visits to maximize her success with intensive lifestyle modifications for her multiple health conditions. She was informed we would discuss her lab results at her next visit unless there is a critical issue that needs to be addressed sooner. Cynthia Li agreed to keep her next visit at the agreed upon time to discuss these results.  Objective:   Blood pressure 131/80, pulse 66, temperature 98.5 F (36.9 C), height 5\' 5"  (1.651 m), weight 222 lb (100.7 kg), last menstrual period 09/23/2007, SpO2 97 %. Body mass index is 36.94 kg/m.  EKG: Sinus  Rhythm with a rate of 78. Low voltage in precordial leads. RSR(V1) - nondiagnostic. Abnormal.  Indirect Calorimeter completed today shows a VO2 of 152 and a REE of 1055.  Her calculated basal metabolic rate is 11/21/2007 thus her basal metabolic rate is worse than expected.  General: Cooperative, alert, well developed, in no acute distress. HEENT: Conjunctivae and lids unremarkable. Cardiovascular: Regular rhythm.  Lungs: Normal work of breathing. Neurologic: No focal deficits.   Lab Results  Component Value Date   CREATININE 0.60 09/03/2019   BUN 14 09/03/2019   NA 140 09/03/2019   K 4.0 09/03/2019   CL 108 09/03/2019   CO2 24 09/03/2019   Lab Results  Component Value Date   ALT 16 04/12/2018   AST 17 04/12/2018   ALKPHOS 100  04/12/2018   BILITOT <0.2 04/12/2018   No results found for: HGBA1C No results found for: INSULIN Lab Results  Component Value Date   TSH 1.930 04/12/2018   No results found for: CHOL, HDL, LDLCALC, LDLDIRECT, TRIG, CHOLHDL Lab Results  Component Value Date   WBC 9.1 09/03/2019   HGB 10.4 (L) 09/03/2019   HCT 33.5 (L) 09/03/2019   MCV 92.5 09/03/2019   PLT 221 09/03/2019   No results found for: IRON, TIBC, FERRITIN  Obesity Behavioral Intervention Visit Documentation for Insurance:   Approximately 15 minutes were spent on the discussion below.  ASK: We discussed the diagnosis of obesity with Cynthia Li today and Cynthia Li agreed to give 14/08/2019 permission to discuss obesity behavioral modification therapy today.  ASSESS: Cynthia Li has the diagnosis of obesity and her BMI today is 36.9. Cynthia Li is in the action stage of change.   ADVISE: Cynthia Li was educated on the multiple health risks of obesity as well as the benefit of weight loss to improve her health. She was advised of the need for long term treatment and the importance of lifestyle modifications to improve her current health and to decrease her risk of future health problems.  AGREE: Multiple dietary modification  options and treatment options were discussed and Cynthia Li agreed to follow the recommendations documented in the above note.  ARRANGE: Taneika was educated on the importance of frequent visits to treat obesity as outlined per CMS and USPSTF guidelines and agreed to schedule her next follow up appointment today.  Attestation Statements:   Reviewed by clinician on day of visit: allergies, medications, problem list, medical history, surgical history, family history, social history, and previous encounter notes.  Time spent on visit including pre-visit chart review and post-visit care was 40 minutes.   Fernanda Drum, am acting as Energy manager for [employer], DO   I have reviewed the above documentation for accuracy  and completeness, and I agree with the above. Corinna Capra, DO

## 2019-10-20 LAB — HEMOGLOBIN A1C
Est. average glucose Bld gHb Est-mCnc: 108 mg/dL
Hgb A1c MFr Bld: 5.4 % (ref 4.8–5.6)

## 2019-10-20 LAB — INSULIN, RANDOM: INSULIN: 10.3 u[IU]/mL (ref 2.6–24.9)

## 2019-10-24 ENCOUNTER — Encounter (INDEPENDENT_AMBULATORY_CARE_PROVIDER_SITE_OTHER): Payer: Self-pay | Admitting: Bariatrics

## 2019-10-24 NOTE — Telephone Encounter (Signed)
Please review

## 2019-10-26 ENCOUNTER — Other Ambulatory Visit: Payer: Self-pay | Admitting: Orthopaedic Surgery

## 2019-10-26 ENCOUNTER — Telehealth: Payer: Self-pay

## 2019-10-26 DIAGNOSIS — M545 Low back pain: Secondary | ICD-10-CM | POA: Diagnosis not present

## 2019-10-26 DIAGNOSIS — M6281 Muscle weakness (generalized): Secondary | ICD-10-CM | POA: Diagnosis not present

## 2019-10-26 DIAGNOSIS — M79604 Pain in right leg: Secondary | ICD-10-CM | POA: Diagnosis not present

## 2019-10-26 DIAGNOSIS — M79605 Pain in left leg: Secondary | ICD-10-CM | POA: Diagnosis not present

## 2019-10-26 MED ORDER — VITAMIN D-3 125 MCG (5000 UT) PO TABS: 5000.0000 [IU] | ORAL_TABLET | Freq: Every day | ORAL | 6 refills | Status: AC

## 2019-10-26 NOTE — Telephone Encounter (Signed)
I got a refill request from [facility] for a refill Vitmain D3

## 2019-11-02 ENCOUNTER — Ambulatory Visit (INDEPENDENT_AMBULATORY_CARE_PROVIDER_SITE_OTHER): Payer: Medicare Other | Admitting: Bariatrics

## 2019-11-05 ENCOUNTER — Ambulatory Visit: Payer: Medicare Other

## 2019-11-06 ENCOUNTER — Ambulatory Visit: Payer: Medicare Other | Attending: Internal Medicine

## 2019-11-06 DIAGNOSIS — Z23 Encounter for immunization: Secondary | ICD-10-CM

## 2019-11-06 NOTE — Progress Notes (Signed)
   Covid-19 Vaccination Clinic  Name:  Cynthia Li    MRN: [id number] DOB: Jan 19, 1956  11/06/2019  Cynthia Li was observed post Covid-19 immunization for 15 minutes without incidence. She was provided with Vaccine Information Sheet and instruction to access the V-Safe system.   Cynthia Li was instructed to call 911 with any severe reactions post vaccine: Marland Kitchen Difficulty breathing  . Swelling of your face and throat  . A fast heartbeat  . A bad rash all over your body  . Dizziness and weakness    Immunizations Administered    Name Date Dose VIS Date Route   Pfizer COVID-19 Vaccine 11/06/2019  9:35 AM 0.3 mL 09/02/2019 Intramuscular   Manufacturer: [employer], [employer]   Lot: [id number]   NDC: [address]-9

## 2019-11-07 DIAGNOSIS — M6281 Muscle weakness (generalized): Secondary | ICD-10-CM | POA: Diagnosis not present

## 2019-11-07 DIAGNOSIS — M545 Low back pain: Secondary | ICD-10-CM | POA: Diagnosis not present

## 2019-11-07 DIAGNOSIS — M79604 Pain in right leg: Secondary | ICD-10-CM | POA: Diagnosis not present

## 2019-11-07 DIAGNOSIS — M79605 Pain in left leg: Secondary | ICD-10-CM | POA: Diagnosis not present

## 2019-11-09 ENCOUNTER — Ambulatory Visit: Payer: Self-pay

## 2019-11-09 ENCOUNTER — Ambulatory Visit (INDEPENDENT_AMBULATORY_CARE_PROVIDER_SITE_OTHER): Payer: Medicare Other | Admitting: Physician Assistant

## 2019-11-09 ENCOUNTER — Encounter: Payer: Self-pay | Admitting: Physician Assistant

## 2019-11-09 ENCOUNTER — Other Ambulatory Visit: Payer: Self-pay

## 2019-11-09 DIAGNOSIS — Z96642 Presence of left artificial hip joint: Secondary | ICD-10-CM

## 2019-11-09 DIAGNOSIS — Z96641 Presence of right artificial hip joint: Secondary | ICD-10-CM

## 2019-11-09 NOTE — Progress Notes (Signed)
HPI: Cynthia Li comes in today with left hip pain for the past 2 days.  She has had no particular injury however she was doing light extensions at PT on Monday.  Most her pain is in the groin area down the quad area.  No numbness tingling down the leg.  She is status post left total hip arthroplasty now 4 months.  She is able to ambulate with a cane.  She is taken some Tylenol but otherwise no medications for this.  Review of systems: Negative for fevers chills shortness of breath  Physical exam: General: Well-developed well-nourished female no acute distress ambulates with a cane and an antalgic gait. Bilateral hips good range of motion extremes of external rotation causes pain left hip.  She has slight tenderness over the trochanteric region both hips.  Radiographs: AP pelvis lateral view both hips: No acute fractures.  Status post bilateral total hip arthroplasties with well-seated components without any abnormalities.  Impression: Left hip pain possible muscle strain.  Plan: She has Flexeril at home that she will begin taking at night.  Begin taking Aleve 2 tablets twice daily with food.  She can continue to take Tylenol throughout the day for the pain as needed.  Like to see her back in 2 weeks if her pain persists or becomes worse.

## 2019-11-11 ENCOUNTER — Encounter

## 2019-11-11 ENCOUNTER — Ambulatory Visit: Payer: BC Managed Care – PPO | Admitting: Obstetrics & Gynecology

## 2019-11-14 DIAGNOSIS — M545 Low back pain: Secondary | ICD-10-CM | POA: Diagnosis not present

## 2019-11-14 DIAGNOSIS — M79604 Pain in right leg: Secondary | ICD-10-CM | POA: Diagnosis not present

## 2019-11-14 DIAGNOSIS — M79605 Pain in left leg: Secondary | ICD-10-CM | POA: Diagnosis not present

## 2019-11-14 DIAGNOSIS — M6281 Muscle weakness (generalized): Secondary | ICD-10-CM | POA: Diagnosis not present

## 2019-11-15 NOTE — Telephone Encounter (Signed)
Left patient a VM requesting she call back to make a nurse visit for Prolia-she owes $0 at check-in and is ready to be scheduled

## 2019-11-23 ENCOUNTER — Other Ambulatory Visit: Payer: Self-pay

## 2019-11-23 ENCOUNTER — Ambulatory Visit (INDEPENDENT_AMBULATORY_CARE_PROVIDER_SITE_OTHER): Payer: Medicare Other | Admitting: Physician Assistant

## 2019-11-23 ENCOUNTER — Encounter: Payer: Self-pay | Admitting: Physician Assistant

## 2019-11-23 DIAGNOSIS — Z96641 Presence of right artificial hip joint: Secondary | ICD-10-CM

## 2019-11-23 DIAGNOSIS — M7062 Trochanteric bursitis, left hip: Secondary | ICD-10-CM | POA: Diagnosis not present

## 2019-11-23 MED ORDER — CYCLOBENZAPRINE HCL 5 MG PO TABS: 5.0000 mg | ORAL_TABLET | Freq: Three times a day (TID) | ORAL | 1 refills | Status: DC | PRN

## 2019-11-23 MED ORDER — CYCLOBENZAPRINE HCL 10 MG PO TABS: 10.0000 mg | ORAL_TABLET | Freq: Three times a day (TID) | ORAL | 0 refills | Status: DC | PRN

## 2019-11-23 NOTE — Progress Notes (Signed)
HPI: Cynthia Li returns today follow-up status post left total hip arthroplasty now 4 months 22 days.  She is here for follow-up after muscle strain.  She states whenever she went back to doing exercises with physical therapy the day caused her pain to come become worse.  She stopped doing any exercise.  She feels overall better range of motion and strength have improved.  She is 50% better than at last visit.  She denies any numbness tingling down the leg.  She continues to use a cane.  She is asking for Flexeril instead of Robaxin which she has been taking as a muscle relaxant.  Review of systems: Please see HPI otherwise negative  Physical exam: General well-developed well-nourished female no acute distress mood and affect appropriate.  Psych alert and orient x3.  Left hip good range of motion.  Tenderness over the left trochanteric region.  Calf supple nontender dorsiflexion plantarflexion ankle intact.  Walks with antalgic gait with use cane in right hand.  Impression: Status post left total hip arthroplasty 07/01/2019 Left hip muscle strain  Plan: Offered an injection for trochanteric bursitis she defers.  Therefore we will have her do some IT band stretching.  She will refrain from any activities with physical therapy, especially knee extensions and hip abduction, that aggravate the hip.  Should continue Aleve 2 tablets twice daily with food.  Tylenol up to 800 mg 3 times daily.  Flexeril was called in for today.

## 2019-11-29 ENCOUNTER — Ambulatory Visit: Payer: Medicare Other | Attending: Internal Medicine

## 2019-11-29 DIAGNOSIS — Z23 Encounter for immunization: Secondary | ICD-10-CM | POA: Insufficient documentation

## 2019-11-29 NOTE — Progress Notes (Signed)
   Covid-19 Vaccination Clinic  Name:  Cynthia Li    MRN: [id number] DOB: 12/07/1955  11/29/2019  [name] was observed post Covid-19 immunization for 15 minutes without incident. She was provided with Vaccine Information Sheet and instruction to access the V-Safe system.   [name] was instructed to call 911 with any severe reactions post vaccine: Marland Kitchen Difficulty breathing  . Swelling of face and throat  . A fast heartbeat  . A bad rash all over body  . Dizziness and weakness   Immunizations Administered    Name Date Dose VIS Date Route   Pfizer COVID-19 Vaccine 11/29/2019  8:32 AM 0.3 mL 09/02/2019 Intramuscular   Manufacturer: [employer], [employer]   Lot: [id number]   NDC: [address]-4

## 2019-12-01 ENCOUNTER — Telehealth: Payer: Self-pay | Admitting: Orthopaedic Surgery

## 2019-12-01 NOTE — Telephone Encounter (Signed)
She does not need antibiotics from my standpoint.

## 2019-12-01 NOTE — Telephone Encounter (Signed)
Please advise 

## 2019-12-01 NOTE — Telephone Encounter (Signed)
Patient called and has a dental appt in a couple of weeks and needs an antibiotic. She had 2 hip replacements 1 in October & December.2020  Please call patient to advise.  [phone]

## 2019-12-01 NOTE — Telephone Encounter (Signed)
Called and notified patient. Informed patient that if dental office needs a note stating antibiotics are no longer required  to just give [place] a called and provide a fax number.

## 2019-12-08 ENCOUNTER — Other Ambulatory Visit: Payer: Self-pay | Admitting: Physician Assistant

## 2019-12-08 NOTE — Telephone Encounter (Signed)
LVM Rx is sent to pharmacy.

## 2019-12-14 ENCOUNTER — Ambulatory Visit (INDEPENDENT_AMBULATORY_CARE_PROVIDER_SITE_OTHER): Payer: Medicare Other | Admitting: Physician Assistant

## 2019-12-14 ENCOUNTER — Other Ambulatory Visit: Payer: Self-pay

## 2019-12-14 ENCOUNTER — Encounter: Payer: Self-pay | Admitting: Physician Assistant

## 2019-12-14 DIAGNOSIS — Z96641 Presence of right artificial hip joint: Secondary | ICD-10-CM

## 2019-12-14 DIAGNOSIS — Z96642 Presence of left artificial hip joint: Secondary | ICD-10-CM

## 2019-12-14 DIAGNOSIS — H2513 Age-related nuclear cataract, bilateral: Secondary | ICD-10-CM | POA: Diagnosis not present

## 2019-12-14 DIAGNOSIS — H524 Presbyopia: Secondary | ICD-10-CM | POA: Diagnosis not present

## 2019-12-14 MED ORDER — CYCLOBENZAPRINE HCL 5 MG PO TABS: 5.0000 mg | ORAL_TABLET | Freq: Three times a day (TID) | ORAL | 1 refills | Status: DC | PRN

## 2019-12-14 NOTE — Progress Notes (Signed)
HPI: [name] returns today follow-up history of left hip pain.  She is status post left total hip arthroplasty 07/01/2019 and right total hip arthroplasty 09/02/2019.  States overall that her left hip is getting better.  Mostly soreness with ambulating.  She does not ambulate with a cane outside the home but does outside the home ambulating for safety purposes.  She feels that walking helps.  She stopped the abduction exercises.  Physical exam: Bilateral hips good range of motion without pain.  Left hip minimal tenderness over the trochanteric region.  She walks with a slight antalgic gait and improves as she ambulates.  She is able to get up and down much easier than she was at last visit.  Impression: Status post bilateral total hip arthroplasties Left hip trochanteric bursitis  Plan: Offered trochanteric injection however she feels she is improving therefore we will continue with laying her just do some stretching exercises she did ask for refill on her Flexeril today.  We will see her back in September 2021 at that time AP pelvis and lateral view of both hips.  She will follow-up with [place] sooner if there is any questions  concerns.                                                                                                                    ++                                                                                                                                                                                                                              +

## 2020-01-04 NOTE — Telephone Encounter (Signed)
LVM for pt to be set up for Prolia She has been clear since October but had his surgery. She owes $0 PA is [id number] effective until 06/30/20  She is overdue

## 2020-02-06 ENCOUNTER — Telehealth: Payer: Self-pay

## 2020-02-06 NOTE — Telephone Encounter (Signed)
Patient called and wants to wait on the prolia injection until she gets another bone density scan in July. She said she would call when she's ready to reschedule.

## 2020-02-06 NOTE — Telephone Encounter (Signed)
According to summary of benefits, patient can get Prolia injection free of charge if sent through [facility] outpatient pharmacy.  Attempted to contact pt and inform her of this. Pt did not answer. Voicemail was left requesting a call back as soon as possible.

## 2020-02-07 NOTE — Telephone Encounter (Signed)
Noted. Ty!

## 2020-02-07 NOTE — Telephone Encounter (Signed)
Please be aware 

## 2020-02-08 NOTE — Telephone Encounter (Signed)
Pt has been discontinued from the active Prolia patient portal for the time being.

## 2020-04-09 ENCOUNTER — Other Ambulatory Visit: Payer: Self-pay | Admitting: Internal Medicine

## 2020-04-09 DIAGNOSIS — M810 Age-related osteoporosis without current pathological fracture: Secondary | ICD-10-CM

## 2020-04-11 ENCOUNTER — Ambulatory Visit
Admission: RE | Admit: 2020-04-11 | Discharge: 2020-04-11 | Disposition: A | Discharge status: Home / Self Care | Payer: Medicare Other | Source: Ambulatory Visit | Attending: Internal Medicine | Admitting: Internal Medicine

## 2020-04-11 ENCOUNTER — Other Ambulatory Visit: Payer: Self-pay

## 2020-04-11 DIAGNOSIS — M85832 Other specified disorders of bone density and structure, left forearm: Secondary | ICD-10-CM | POA: Diagnosis not present

## 2020-04-11 DIAGNOSIS — M810 Age-related osteoporosis without current pathological fracture: Secondary | ICD-10-CM

## 2020-04-11 DIAGNOSIS — Z780 Asymptomatic menopausal state: Secondary | ICD-10-CM | POA: Diagnosis not present

## 2020-04-12 DIAGNOSIS — R82998 Other abnormal findings in urine: Secondary | ICD-10-CM | POA: Diagnosis not present

## 2020-04-16 ENCOUNTER — Ambulatory Visit: Payer: Medicare Other | Admitting: Orthopaedic Surgery

## 2020-04-20 DIAGNOSIS — M810 Age-related osteoporosis without current pathological fracture: Secondary | ICD-10-CM | POA: Diagnosis not present

## 2020-04-20 DIAGNOSIS — E785 Hyperlipidemia, unspecified: Secondary | ICD-10-CM | POA: Diagnosis not present

## 2020-05-30 ENCOUNTER — Encounter: Payer: Self-pay | Admitting: Internal Medicine

## 2020-05-30 ENCOUNTER — Ambulatory Visit: Payer: Medicare Other | Admitting: Physician Assistant

## 2020-06-01 ENCOUNTER — Ambulatory Visit: Payer: Medicare Other | Admitting: Internal Medicine

## 2020-06-11 ENCOUNTER — Ambulatory Visit: Payer: Self-pay

## 2020-06-11 ENCOUNTER — Ambulatory Visit (INDEPENDENT_AMBULATORY_CARE_PROVIDER_SITE_OTHER): Payer: Medicare Other | Admitting: Physician Assistant

## 2020-06-11 ENCOUNTER — Encounter: Payer: Self-pay | Admitting: Physician Assistant

## 2020-06-11 DIAGNOSIS — Z96641 Presence of right artificial hip joint: Secondary | ICD-10-CM

## 2020-06-11 DIAGNOSIS — Z96642 Presence of left artificial hip joint: Secondary | ICD-10-CM

## 2020-06-11 NOTE — Progress Notes (Signed)
HPI: Cynthia Li returns today status post left total hip arthroplasty 07/01/2019 right total hip arthroplasty 09/02/2019.  She states both hips are doing well.  She has some difficulty getting out of a golf cart due to stiffness in both hips but states that her range of motion is much better than she had prior to surgery.  She is wondering if physical therapy could help with range of motion especially with her left leg which she has difficulty crossing.   Physical exam: Bilateral hips fluid motion of both hips.  Slightly decreased external rotation of left hip compared to the right hip.  She ambulates with a slight antalgic gait whenever she first gets up to ambulate but this walks out quickly.  Radiographs:AP pelvis lateral view both hips: Shows well-seated left total hip arthroplasty and right total hip arthroplasty.  Both hips are well located.  No bony abnormalities no acute fractures.  Impression: Status post bilateral total hip arthroplasties  Plan: We will send her to formal physical therapy at [facility] physical therapy to work on range of motion of both hips.  She will follow-up with [place] in early January no radiographs at that time unless indicated.  Mainly visit just to see if her range of motion is increased.  Questions were encouraged and answered at length.

## 2020-06-28 DIAGNOSIS — J029 Acute pharyngitis, unspecified: Secondary | ICD-10-CM | POA: Diagnosis not present

## 2020-06-28 DIAGNOSIS — J020 Streptococcal pharyngitis: Secondary | ICD-10-CM | POA: Diagnosis not present

## 2020-07-13 DIAGNOSIS — L02222 Furuncle of back [any part, except buttock]: Secondary | ICD-10-CM | POA: Diagnosis not present

## 2020-07-13 DIAGNOSIS — E785 Hyperlipidemia, unspecified: Secondary | ICD-10-CM | POA: Diagnosis not present

## 2020-07-16 ENCOUNTER — Other Ambulatory Visit: Payer: Self-pay | Admitting: Obstetrics & Gynecology

## 2020-07-16 DIAGNOSIS — L02222 Furuncle of back [any part, except buttock]: Secondary | ICD-10-CM | POA: Diagnosis not present

## 2020-07-16 DIAGNOSIS — Z0000 Encounter for general adult medical examination without abnormal findings: Secondary | ICD-10-CM

## 2020-07-18 ENCOUNTER — Ambulatory Visit
Admission: RE | Admit: 2020-07-18 | Discharge: 2020-07-18 | Disposition: A | Discharge status: Home / Self Care | Payer: Medicare Other | Source: Ambulatory Visit | Attending: Obstetrics & Gynecology | Admitting: Obstetrics & Gynecology

## 2020-07-18 ENCOUNTER — Other Ambulatory Visit: Payer: Self-pay

## 2020-07-18 DIAGNOSIS — L089 Local infection of the skin and subcutaneous tissue, unspecified: Secondary | ICD-10-CM | POA: Diagnosis not present

## 2020-07-18 DIAGNOSIS — Z1231 Encounter for screening mammogram for malignant neoplasm of breast: Secondary | ICD-10-CM | POA: Diagnosis not present

## 2020-07-18 DIAGNOSIS — Z0000 Encounter for general adult medical examination without abnormal findings: Secondary | ICD-10-CM

## 2020-07-18 DIAGNOSIS — L723 Sebaceous cyst: Secondary | ICD-10-CM | POA: Diagnosis not present

## 2020-07-21 DIAGNOSIS — J029 Acute pharyngitis, unspecified: Secondary | ICD-10-CM | POA: Diagnosis not present

## 2020-09-17 DIAGNOSIS — J0140 Acute pansinusitis, unspecified: Secondary | ICD-10-CM | POA: Diagnosis not present

## 2020-09-19 DIAGNOSIS — R519 Headache, unspecified: Secondary | ICD-10-CM | POA: Diagnosis not present

## 2020-09-19 DIAGNOSIS — R0981 Nasal congestion: Secondary | ICD-10-CM | POA: Diagnosis not present

## 2020-09-19 DIAGNOSIS — J019 Acute sinusitis, unspecified: Secondary | ICD-10-CM | POA: Diagnosis not present

## 2020-09-24 ENCOUNTER — Ambulatory Visit: Payer: Medicare Other | Admitting: Physician Assistant

## 2020-10-18 ENCOUNTER — Ambulatory Visit: Payer: Medicare Other | Admitting: Obstetrics & Gynecology

## 2020-10-31 ENCOUNTER — Ambulatory Visit (HOSPITAL_BASED_OUTPATIENT_CLINIC_OR_DEPARTMENT_OTHER): Payer: Medicare Other | Admitting: Obstetrics & Gynecology

## 2020-11-05 ENCOUNTER — Other Ambulatory Visit: Payer: Self-pay

## 2020-11-05 ENCOUNTER — Ambulatory Visit (INDEPENDENT_AMBULATORY_CARE_PROVIDER_SITE_OTHER): Payer: Medicare Other | Admitting: Obstetrics & Gynecology

## 2020-11-05 ENCOUNTER — Encounter (HOSPITAL_BASED_OUTPATIENT_CLINIC_OR_DEPARTMENT_OTHER): Payer: Self-pay | Admitting: Obstetrics & Gynecology

## 2020-11-05 VITALS — BP 142/90 | HR 64 | Ht 66.0 in | Wt 211.0 lb

## 2020-11-05 DIAGNOSIS — Z01419 Encounter for gynecological examination (general) (routine) without abnormal findings: Secondary | ICD-10-CM | POA: Diagnosis not present

## 2020-11-05 DIAGNOSIS — M8580 Other specified disorders of bone density and structure, unspecified site: Secondary | ICD-10-CM

## 2020-11-05 DIAGNOSIS — E785 Hyperlipidemia, unspecified: Secondary | ICD-10-CM | POA: Diagnosis not present

## 2020-11-05 DIAGNOSIS — M810 Age-related osteoporosis without current pathological fracture: Secondary | ICD-10-CM

## 2020-11-05 NOTE — Progress Notes (Signed)
65 y.o. G68P1011 Single White or Caucasian female here for breast and pelvic exam.  I have been following her BMD as well.  However, Dr. Sharon Mt ordered this last one last year.  She is having constipation that wasn't present prior to her last hip replacement.  Hip pain is pretty much resolved.    Denies vaginal bleeding.  Patient's last menstrual period was 09/23/2007.          Sexually active: No.  H/O STD:  no  Health Maintenance: PCP:  Dr Sharon Mt.  Last wellness appt was 09/2020.  Did blood work at that appt:   Vaccines are up to date:  No.  Has not done the pneumovax but has discussed with PCP. Colonoscopy:  cologuard with Dr. Sharon Mt in 2020. MMG:  07/19/2020 BMD:  03/2020, -1.2 Last pap smear:  Not indicated.   H/o abnormal pap smear:  no    reports that she has never smoked. She has never used smokeless tobacco. She reports current alcohol use. She reports that she does not use drugs.  Past Medical History:  Diagnosis Date  . ADD (attention deficit disorder)   . Anxiety   . Arthritis   . Back pain   . GERD (gastroesophageal reflux disease)   . Hiatal hernia   . IBS (irritable bowel syndrome)    age 28  . Joint pain   . Kidney stone 3/14  . Lumbar disc disease   . Osteoarthritis   . Panic disorder   . Pinched nerve    some DDD  . Vitamin D deficiency     Past Surgical History:  Procedure Laterality Date  . BACK SURGERY  09/13/2015   Back Fusion- [facility]   . CHOLECYSTECTOMY  age 29  . CYSTOSCOPY W/ URETERAL STENT PLACEMENT Left 10/13/2014   Procedure: CYSTOSCOPY WITH RETROGRADE PYELOGRAM/URETERAL STENT PLACEMENT;  Surgeon: Sebastian Ache, MD;  Location: [facility];  Service: Urology;  Laterality: Left;  . HOLMIUM LASER APPLICATION Left 10/13/2014   Procedure: HOLMIUM LASER APPLICATION;  Surgeon: Sebastian Ache, MD;  Location: [facility];  Service: Urology;  Laterality: Left;  . KNEE ARTHROSCOPY     right  . NEPHROLITHOTOMY Left 10/13/2014   Procedure:  NEPHROLITHOTOMY PERCUTANEOUS WITH SURGEON ACCESS;  Surgeon: Sebastian Ache, MD;  Location: [facility];  Service: Urology;  Laterality: Left;  . TOTAL ABDOMINAL HYSTERECTOMY  09/2006  . TOTAL HIP ARTHROPLASTY Left 07/01/2019   Procedure: LEFT TOTAL HIP ARTHROPLASTY ANTERIOR APPROACH;  Surgeon: Kathryne Hitch, MD;  Location: [facility];  Service: Orthopedics;  Laterality: Left;  . TOTAL HIP ARTHROPLASTY Right 09/02/2019   Procedure: RIGHT TOTAL HIP ARTHROPLASTY ANTERIOR APPROACH;  Surgeon: Kathryne Hitch, MD;  Location: [facility];  Service: Orthopedics;  Laterality: Right;    Current Outpatient Medications  Medication Sig Dispense Refill  . ALPRAZolam (XANAX) 0.5 MG tablet Take 0.5 mg by mouth daily as needed (panic attacks.).   0  . Azelastine HCl 0.15 % SOLN azelastine 205.5 mcg (0.15 %) nasal spray    . Cholecalciferol (VITAMIN D-3) 125 MCG (5000 UT) TABS Take 5,000 Units by mouth at bedtime. 30 tablet 6  . clotrimazole (LOTRIMIN) 1 % cream Apply 1 application topically 2 (two) times daily.    . diclofenac sodium (VOLTAREN) 1 % GEL Apply 1 application topically daily as needed (arthritis in hands).   3  . docusate sodium (COLACE) 100 MG capsule Take 100 mg by mouth 2 (two) times daily.    . Glucos-Chond-Hyal Ac-Ca Fructo (MOVE  FREE JOINT HEALTH ADVANCE PO) Take 1 tablet by mouth every evening.    Marland Kitchen levocetirizine (XYZAL) 5 MG tablet Take 5 mg by mouth at bedtime.     . magnesium oxide (MAG-OX) 400 MG tablet Take 800 mg by mouth at bedtime.    . Multiple Vitamin (MULTIVITAMIN WITH MINERALS) TABS tablet Take 1 tablet by mouth daily. One-A-Day Multivitamin Women 50+    . Omega-3 Fatty Acids (OMEGA 3 500) 500 MG CAPS Take 500 mg by mouth every evening.    . pantoprazole (PROTONIX) 40 MG tablet Take 40 mg by mouth daily.    . rosuvastatin (CRESTOR) 5 MG tablet 1 tablet    . vitamin E 400 UNIT capsule Take 400 Units by mouth at bedtime.     No current facility-administered medications for  this visit.    Family History  Problem Relation Age of Onset  . Parkinson's disease Father        deceased  . Cancer Father        renal tumor  . Heart attack Father   . Alzheimer's disease Father   . Hypertension Father   . Alcoholism Father   . Ulcerative colitis Sister   . Breast cancer Neg Hx     Review of Systems  All other systems reviewed and are negative.   Exam:   BP (!) 142/90   Pulse 64   Ht 5\' 6"  (1.676 m)   Wt 211 lb (95.7 kg)   LMP 09/23/2007   SpO2 99%   BMI 34.06 kg/m   Height: 5\' 6"  (167.6 cm)  General appearance: alert, cooperative and appears stated age Breasts: normal appearance, no masses or tenderness Abdomen: soft, non-tender; bowel sounds normal; no masses,  no organomegaly Lymph nodes: Cervical, supraclavicular, and axillary nodes normal.  No abnormal inguinal nodes palpated Neurologic: Grossly normal  Pelvic: External genitalia:  no lesions              Urethra:  normal appearing urethra with no masses, tenderness or lesions              Bartholins and Skenes: normal                 Vagina: normal appearing vagina with normal color and discharge, no lesions              Cervix: absent              Pap taken: No. Bimanual Exam:  Uterus:  uterus absent              Adnexa: no mass, fullness, tenderness               Rectovaginal: Confirms               Anus:  normal sphincter tone, no lesions  Chaperone, 11/21/2007, CMA, was present for exam.  Assessment/Plan: 1. Encntr for gyn exam (general) (routine) w/o abn findings - Pap smear not indicated - Cologuard normal 2020 - MMG 10/21 - labs done 09/2020 with Dr. 11/21 - Vaccines reviewed  2. Elevated lipids - followed by Dr. 10/2020  3. Osteopenia, unspecified location - BMD 03/2020  22 minutes of total time was spent for this patient encounter, including preparation, face-to-face counseling with the patient and coordination of care, and documentation of the  encounter.

## 2020-11-05 NOTE — Patient Instructions (Signed)
Tumeric Benefiber Align--the probiotic

## 2021-04-17 ENCOUNTER — Emergency Department (HOSPITAL_BASED_OUTPATIENT_CLINIC_OR_DEPARTMENT_OTHER)
Admission: EM | Admit: 2021-04-17 | Discharge: 2021-04-17 | Disposition: A | Discharge status: Home / Self Care | Payer: Medicare Other | Attending: Emergency Medicine | Admitting: Emergency Medicine

## 2021-04-17 ENCOUNTER — Encounter (HOSPITAL_BASED_OUTPATIENT_CLINIC_OR_DEPARTMENT_OTHER): Payer: Self-pay | Admitting: Emergency Medicine

## 2021-04-17 ENCOUNTER — Other Ambulatory Visit: Payer: Self-pay

## 2021-04-17 DIAGNOSIS — Z79899 Other long term (current) drug therapy: Secondary | ICD-10-CM | POA: Diagnosis not present

## 2021-04-17 DIAGNOSIS — F410 Panic disorder [episodic paroxysmal anxiety] without agoraphobia: Secondary | ICD-10-CM | POA: Insufficient documentation

## 2021-04-17 DIAGNOSIS — Z96643 Presence of artificial hip joint, bilateral: Secondary | ICD-10-CM | POA: Diagnosis not present

## 2021-04-17 DIAGNOSIS — R519 Headache, unspecified: Secondary | ICD-10-CM | POA: Diagnosis present

## 2021-04-17 NOTE — ED Provider Notes (Signed)
[facility] EMERGENCY DEPARTMENT Provider Note  CSN: [id number] Arrival date & time: 04/17/21 1116    History Chief Complaint  Patient presents with   Headache    Cynthia Li is a 65 y.o. female with history of anxiety and atypical migraines reports she began having similar symptoms this morning while on her way to work. Reports a diffuse throbbing headache, palpitations, anxious feeling. She went to her friend's house and her BP was mildly elevated so she came to the ED for evaluation. Since then her symptoms have improved and she is back to baseline now.    Past Medical History:  Diagnosis Date   ADD (attention deficit disorder)    Anxiety    Arthritis    Back pain    GERD (gastroesophageal reflux disease)    Hiatal hernia    IBS (irritable bowel syndrome)    age 27   Joint pain    Kidney stone 3/14   Lumbar disc disease    Osteoarthritis    Panic disorder    Pinched nerve    some DDD   Vitamin D deficiency     Past Surgical History:  Procedure Laterality Date   BACK SURGERY  09/13/2015   Back Fusion- [facility]    CHOLECYSTECTOMY  age 58   CYSTOSCOPY W/ URETERAL STENT PLACEMENT Left 10/13/2014   Procedure: CYSTOSCOPY WITH RETROGRADE PYELOGRAM/URETERAL STENT PLACEMENT;  Surgeon: Sebastian Ache, MD;  Location: [facility];  Service: Urology;  Laterality: Left;   HOLMIUM LASER APPLICATION Left 10/13/2014   Procedure: HOLMIUM LASER APPLICATION;  Surgeon: Sebastian Ache, MD;  Location: [facility];  Service: Urology;  Laterality: Left;   KNEE ARTHROSCOPY     right   NEPHROLITHOTOMY Left 10/13/2014   Procedure: NEPHROLITHOTOMY PERCUTANEOUS WITH SURGEON ACCESS;  Surgeon: Sebastian Ache, MD;  Location: [facility];  Service: Urology;  Laterality: Left;   TOTAL ABDOMINAL HYSTERECTOMY  09/2006   TOTAL HIP ARTHROPLASTY Left 07/01/2019   Procedure: LEFT TOTAL HIP ARTHROPLASTY ANTERIOR APPROACH;  Surgeon: Kathryne Hitch, MD;  Location: [facility];  Service:  Orthopedics;  Laterality: Left;   TOTAL HIP ARTHROPLASTY Right 09/02/2019   Procedure: RIGHT TOTAL HIP ARTHROPLASTY ANTERIOR APPROACH;  Surgeon: Kathryne Hitch, MD;  Location: [facility];  Service: Orthopedics;  Laterality: Right;    Family History  Problem Relation Age of Onset   Parkinson's disease Father        deceased   Cancer Father        renal tumor   Heart attack Father    Alzheimer's disease Father    Hypertension Father    Alcoholism Father    Ulcerative colitis Sister    Breast cancer Neg Hx     Social History   Tobacco Use   Smoking status: Never   Smokeless tobacco: Never  Vaping Use   Vaping Use: Never used  Substance Use Topics   Alcohol use: Yes    Comment: occ   Drug use: No     Home Medications Prior to Admission medications   Medication Sig Start Date End Date Taking? Authorizing Provider  ALPRAZolam Prudy Feeler) 0.5 MG tablet Take 0.5 mg by mouth daily as needed (panic attacks.).  09/08/16   [provider]  Azelastine HCl 0.15 % SOLN azelastine 205.5 mcg (0.15 %) nasal spray    [provider]  Cholecalciferol (VITAMIN D-3) 125 MCG (5000 UT) TABS Take 5,000 Units by mouth at bedtime. 10/26/19   Kathryne Hitch, MD  clotrimazole (  LOTRIMIN) 1 % cream Apply 1 application topically 2 (two) times daily.    [provider]  diclofenac sodium (VOLTAREN) 1 % GEL Apply 1 application topically daily as needed (arthritis in hands).  03/16/18   [provider]  docusate sodium (COLACE) 100 MG capsule Take 100 mg by mouth 2 (two) times daily.    [provider]  Glucos-Chond-Hyal Ac-Ca Fructo (MOVE FREE JOINT HEALTH ADVANCE PO) Take 1 tablet by mouth every evening.    [provider]  levocetirizine (XYZAL) 5 MG tablet Take 5 mg by mouth at bedtime.  10/10/18   [provider]  magnesium oxide (MAG-OX) 400 MG tablet Take 800 mg by mouth at bedtime.    [provider]  Multiple Vitamin  (MULTIVITAMIN WITH MINERALS) TABS tablet Take 1 tablet by mouth daily. One-A-Day Multivitamin Women 50+    [provider]  Omega-3 Fatty Acids (OMEGA 3 500) 500 MG CAPS Take 500 mg by mouth every evening.    [provider]  pantoprazole (PROTONIX) 40 MG tablet Take 40 mg by mouth daily. 11/22/16   [provider]  rosuvastatin (CRESTOR) 5 MG tablet 1 tablet    [provider]  vitamin E 400 UNIT capsule Take 400 Units by mouth at bedtime.    [provider]     Allergies    Codeine, Hydrocodone, Other, and Penicillins   Review of Systems   Review of Systems A comprehensive review of systems was completed and negative except as noted in HPI.    Physical Exam BP 129/82 (BP Location: Right Arm)   Pulse 69   Temp 98.4 F (36.9 C) (Oral)   Resp 18   Ht 5\' 6"  (1.676 m)   Wt 122.9 kg   LMP 09/23/2007   SpO2 100%   BMI 43.74 kg/m   Physical Exam Vitals and nursing note reviewed.  Constitutional:      Appearance: Normal appearance.  HENT:     Head: Normocephalic and atraumatic.     Nose: Nose normal.     Mouth/Throat:     Mouth: Mucous membranes are moist.  Eyes:     Extraocular Movements: Extraocular movements intact.     Conjunctiva/sclera: Conjunctivae normal.  Cardiovascular:     Rate and Rhythm: Normal rate.  Pulmonary:     Effort: Pulmonary effort is normal.     Breath sounds: Normal breath sounds.  Abdominal:     General: Abdomen is flat.     Palpations: Abdomen is soft.     Tenderness: There is no abdominal tenderness.  Musculoskeletal:        General: No swelling. Normal range of motion.     Cervical back: Neck supple.  Skin:    General: Skin is warm and dry.  Neurological:     General: No focal deficit present.     Mental Status: She is alert and oriented to person, place, and time.     Cranial Nerves: No cranial nerve deficit.     Sensory: No sensory deficit.     Motor: No weakness.     Gait: Gait normal.   Psychiatric:        Mood and Affect: Mood normal.     ED Results / Procedures / Treatments   Labs (all labs ordered are listed, but only abnormal results are displayed) Labs Reviewed - No data to display  EKG None   Radiology No results found.  Procedures Procedures  Medications Ordered in the ED  Medications - No data to display   MDM Rules/Calculators/A&P MDM Patient with anxiety/panic induced headache is now back to baseline, symptoms have resolved and she would like to go home. BP mildly elevated on arrival but normalized with relaxing in room. Advised to follow up with her PCP and RTED for any other concerns.   ED Course  I have reviewed the triage vital signs and the nursing notes.  Pertinent labs & imaging results that were available during my care of the patient were reviewed by me and considered in my medical decision making (see chart for details).     Final Clinical Impression(s) / ED Diagnoses Final diagnoses:  Panic attack  Nonintractable headache, unspecified chronicity pattern, unspecified headache type    Rx / DC Orders ED Discharge Orders     None        Pollyann Savoy, MD 04/17/21 [phone]

## 2021-04-17 NOTE — ED Triage Notes (Signed)
Pt reports hx of panic attacks w/ associated headaches.  Hx of "atypical migraines" per pt.  Pt states she felt very anxious this morning and developed an intense headache.  Pt reports anxiety has since subsided and headache lessened, but persists.  Pt checked BP at home = 170systolic

## 2021-07-28 ENCOUNTER — Ambulatory Visit
Admission: RE | Admit: 2021-07-28 | Discharge: 2021-07-28 | Disposition: A | Discharge status: Home / Self Care | Payer: BC Managed Care – PPO | Source: Ambulatory Visit | Attending: Physical Medicine and Rehabilitation | Admitting: Physical Medicine and Rehabilitation

## 2021-07-28 ENCOUNTER — Other Ambulatory Visit: Payer: Self-pay | Admitting: Physical Medicine and Rehabilitation

## 2021-07-28 ENCOUNTER — Other Ambulatory Visit: Payer: Self-pay

## 2021-07-28 DIAGNOSIS — Z139 Encounter for screening, unspecified: Secondary | ICD-10-CM

## 2022-04-30 ENCOUNTER — Encounter (INDEPENDENT_AMBULATORY_CARE_PROVIDER_SITE_OTHER): Payer: Self-pay

## 2022-09-25 ENCOUNTER — Other Ambulatory Visit: Payer: Self-pay | Admitting: Obstetrics & Gynecology

## 2022-09-25 DIAGNOSIS — Z1231 Encounter for screening mammogram for malignant neoplasm of breast: Secondary | ICD-10-CM

## 2022-10-02 ENCOUNTER — Ambulatory Visit
Admission: RE | Admit: 2022-10-02 | Discharge: 2022-10-02 | Disposition: A | Discharge status: Home / Self Care | Payer: Medicare Other | Source: Ambulatory Visit | Attending: Obstetrics & Gynecology | Admitting: Obstetrics & Gynecology

## 2022-10-02 DIAGNOSIS — Z1231 Encounter for screening mammogram for malignant neoplasm of breast: Secondary | ICD-10-CM

## 2022-10-07 ENCOUNTER — Other Ambulatory Visit: Payer: Self-pay | Admitting: Obstetrics & Gynecology

## 2022-10-07 DIAGNOSIS — R928 Other abnormal and inconclusive findings on diagnostic imaging of breast: Secondary | ICD-10-CM

## 2022-10-14 ENCOUNTER — Ambulatory Visit
Admission: RE | Admit: 2022-10-14 | Discharge: 2022-10-14 | Disposition: A | Discharge status: Home / Self Care | Payer: Medicare Other | Source: Ambulatory Visit | Attending: Obstetrics & Gynecology | Admitting: Obstetrics & Gynecology

## 2022-10-14 ENCOUNTER — Ambulatory Visit
Admission: RE | Admit: 2022-10-14 | Discharge: 2022-10-14 | Disposition: A | Discharge status: Home / Self Care | Payer: BC Managed Care – PPO | Source: Ambulatory Visit | Attending: Obstetrics & Gynecology | Admitting: Obstetrics & Gynecology

## 2022-10-14 DIAGNOSIS — R928 Other abnormal and inconclusive findings on diagnostic imaging of breast: Secondary | ICD-10-CM

## 2022-11-06 ENCOUNTER — Ambulatory Visit (INDEPENDENT_AMBULATORY_CARE_PROVIDER_SITE_OTHER): Payer: Medicare Other | Admitting: Obstetrics & Gynecology

## 2022-11-06 ENCOUNTER — Encounter (HOSPITAL_BASED_OUTPATIENT_CLINIC_OR_DEPARTMENT_OTHER): Payer: Self-pay | Admitting: Obstetrics & Gynecology

## 2022-11-06 VITALS — BP 134/82 | HR 64 | Ht 64.5 in

## 2022-11-06 DIAGNOSIS — B372 Candidiasis of skin and nail: Secondary | ICD-10-CM

## 2022-11-06 DIAGNOSIS — M85832 Other specified disorders of bone density and structure, left forearm: Secondary | ICD-10-CM

## 2022-11-06 DIAGNOSIS — Z23 Encounter for immunization: Secondary | ICD-10-CM

## 2022-11-06 DIAGNOSIS — Z01419 Encounter for gynecological examination (general) (routine) without abnormal findings: Secondary | ICD-10-CM

## 2022-11-06 DIAGNOSIS — Z90710 Acquired absence of both cervix and uterus: Secondary | ICD-10-CM

## 2022-11-06 MED ORDER — NYSTATIN 100000 UNIT/GM EX CREA: 1.0000 | TOPICAL_CREAM | Freq: Two times a day (BID) | CUTANEOUS | 2 refills | Status: AC

## 2022-11-06 NOTE — Progress Notes (Signed)
67 y.o. G21P1011 Single White or Caucasian female here for breast and pelvic exam.  Doing well.  Denies vaginal bleeding.  Working part time for [employer].    Patient's last menstrual period was 09/22/2006.          Sexually active: No.  The current method of family planning is status post hysterectomy.    Exercising: trying to walk every day and get 10,000 steps daily Smoker:  no  Health Maintenance: Pap:  not indicated History of abnormal Pap:  no MMG:  10/14/2022 Colonoscopy:  cologuard with Dr. Jackelyn Poling in 2020 and then 2023 BMD:   03/2020, mild osteopenia Screening Labs: done with Dr. Jackelyn Poling   reports that she has never smoked. She has never used smokeless tobacco. She reports current alcohol use. She reports that she does not use drugs.  Past Medical History:  Diagnosis Date   ADD (attention deficit disorder)    Anxiety    Arthritis    Back pain    GERD (gastroesophageal reflux disease)    Hiatal hernia    IBS (irritable bowel syndrome)    age 62   Joint pain    Kidney stone 3/14   Lumbar disc disease    Osteoarthritis    Panic disorder    Pinched nerve    some DDD   Vitamin D deficiency     Past Surgical History:  Procedure Laterality Date   BACK SURGERY  09/13/2015   Back Fusion- [facility]    CHOLECYSTECTOMY  age 41   [place] Left 10/13/2014   Procedure: CYSTOSCOPY WITH RETROGRADE PYELOGRAM/URETERAL STENT PLACEMENT;  Surgeon: Alexis Frock, MD;  Location: [facility];  Service: Urology;  Laterality: Left;   HOLMIUM LASER APPLICATION Left [id number]   Procedure: HOLMIUM LASER APPLICATION;  Surgeon: Alexis Frock, MD;  Location: [facility];  Service: Urology;  Laterality: Left;   KNEE ARTHROSCOPY     right   NEPHROLITHOTOMY Left 10/13/2014   Procedure: NEPHROLITHOTOMY PERCUTANEOUS WITH SURGEON ACCESS;  Surgeon: Alexis Frock, MD;  Location: [facility];  Service: Urology;  Laterality: Left;   TOTAL ABDOMINAL HYSTERECTOMY  09/2006   TOTAL  HIP ARTHROPLASTY Left 07/01/2019   Procedure: LEFT TOTAL HIP ARTHROPLASTY ANTERIOR APPROACH;  Surgeon: Mcarthur Rossetti, MD;  Location: [facility];  Service: Orthopedics;  Laterality: Left;   TOTAL HIP ARTHROPLASTY Right 09/02/2019   Procedure: RIGHT TOTAL HIP ARTHROPLASTY ANTERIOR APPROACH;  Surgeon: Mcarthur Rossetti, MD;  Location: [facility];  Service: Orthopedics;  Laterality: Right;    Current Outpatient Medications  Medication Sig Dispense Refill   ALPRAZolam (XANAX) 0.5 MG tablet Take 0.5 mg by mouth daily as needed (panic attacks.).   0   Azelastine HCl 0.15 % SOLN azelastine 205.5 mcg (0.15 %) nasal spray     Cholecalciferol (VITAMIN D-3) 125 MCG (5000 UT) TABS Take 5,000 Units by mouth at bedtime. 30 tablet 6   clotrimazole (LOTRIMIN) 1 % cream Apply 1 application topically 2 (two) times daily.     diclofenac sodium (VOLTAREN) 1 % GEL Apply 1 application topically daily as needed (arthritis in hands).   3   docusate sodium (COLACE) 100 MG capsule Take 100 mg by mouth 2 (two) times daily.     Glucos-Chond-Hyal Ac-Ca Fructo (MOVE FREE JOINT HEALTH ADVANCE PO) Take 1 tablet by mouth every evening.     levocetirizine (XYZAL) 5 MG tablet Take 5 mg by mouth at bedtime.      magnesium oxide (MAG-OX) 400 MG tablet  Take 800 mg by mouth at bedtime.     Multiple Vitamin (MULTIVITAMIN WITH MINERALS) TABS tablet Take 1 tablet by mouth daily. One-A-Day Multivitamin Women 50+     Omega-3 Fatty Acids (OMEGA 3 500) 500 MG CAPS Take 500 mg by mouth every evening.     pantoprazole (PROTONIX) 40 MG tablet Take 40 mg by mouth daily.     rosuvastatin (CRESTOR) 5 MG tablet 1 tablet     vitamin E 400 UNIT capsule Take 400 Units by mouth at bedtime.     No current facility-administered medications for this visit.    Family History  Problem Relation Age of Onset   Parkinson's disease Father        deceased   Cancer Father        renal tumor   Heart attack Father    Alzheimer's disease Father     Hypertension Father    Alcoholism Father    Ulcerative colitis Sister    Breast cancer Neg Hx     ROS: Constitutional: negative Genitourinary:negative  Exam:   BP 134/82   Pulse 64   Ht 5' 4.5" (1.638 m) Comment: reported  LMP 09/22/2006   BMI 45.80 kg/m   Height: 5' 4.5" (163.8 cm) (reported)  General appearance: alert, cooperative and appears stated age Head: Normocephalic, without obvious abnormality, atraumatic Neck: no adenopathy, supple, symmetrical, trachea midline and thyroid normal to inspection and palpation Lungs: clear to auscultation bilaterally Breasts: normal appearance, no masses or tenderness Heart: regular rate and rhythm Abdomen: soft, non-tender; bowel sounds normal; no masses,  no organomegaly Extremities: extremities normal, atraumatic, no cyanosis or edema Skin: Skin color, texture, turgor normal. Erythematous rash on right groin region Lymph nodes: Cervical, supraclavicular, and axillary nodes normal. No abnormal inguinal nodes palpated Neurologic: Grossly normal   Pelvic: External genitalia:  no lesions              Urethra:  normal appearing urethra with no masses, tenderness or lesions              Bartholins and Skenes: normal                 Vagina: normal appearing vagina with normal color and no discharge, no lesions              Cervix: absent              Pap taken: No. Bimanual Exam:  Uterus:  uterus absent              Adnexa: no mass, fullness, tenderness               Rectovaginal: Confirms               Anus:  normal sphincter tone, no lesions  Chaperone, Octaviano Batty, CMA, was present for exam.  Assessment/Plan: 1. Encntr for gyn exam (general) (routine) w/o abn findings - Pap smear not indicated - Mammogram 09/2022 - Colonoscopy has been declined.  Cologuard neg 11/28/2021 - Bone mineral density 03/2020 - lab work done with PCP, Dr. Ardeth Perfect - vaccines reviewed/updated.  Tdap updated today  2. Osteopenia of left forearm -  done 03/2020  3. H/O: hysterectomy  4. Candidal skin infection - nystatin cream (MYCOSTATIN); Apply 1 Application topically 2 (two) times daily. Apply to affected area BID for up to 7 days.  Dispense: 30 g; Refill: 2

## 2022-11-10 ENCOUNTER — Encounter: Payer: Self-pay | Admitting: *Deleted

## 2023-08-30 ENCOUNTER — Other Ambulatory Visit: Payer: Self-pay

## 2023-08-30 ENCOUNTER — Emergency Department (HOSPITAL_BASED_OUTPATIENT_CLINIC_OR_DEPARTMENT_OTHER): Payer: Medicare Other

## 2023-08-30 ENCOUNTER — Encounter (HOSPITAL_BASED_OUTPATIENT_CLINIC_OR_DEPARTMENT_OTHER): Payer: Self-pay

## 2023-08-30 ENCOUNTER — Emergency Department (HOSPITAL_BASED_OUTPATIENT_CLINIC_OR_DEPARTMENT_OTHER)
Admission: EM | Admit: 2023-08-30 | Discharge: 2023-08-30 | Disposition: A | Discharge status: Home / Self Care | Payer: Medicare Other | Attending: Emergency Medicine | Admitting: Emergency Medicine

## 2023-08-30 ENCOUNTER — Emergency Department (HOSPITAL_BASED_OUTPATIENT_CLINIC_OR_DEPARTMENT_OTHER): Payer: Medicare Other | Admitting: Radiology

## 2023-08-30 DIAGNOSIS — S82141A Displaced bicondylar fracture of right tibia, initial encounter for closed fracture: Secondary | ICD-10-CM | POA: Diagnosis not present

## 2023-08-30 DIAGNOSIS — S80212A Abrasion, left knee, initial encounter: Secondary | ICD-10-CM | POA: Insufficient documentation

## 2023-08-30 DIAGNOSIS — W010XXA Fall on same level from slipping, tripping and stumbling without subsequent striking against object, initial encounter: Secondary | ICD-10-CM | POA: Diagnosis not present

## 2023-08-30 DIAGNOSIS — S8991XA Unspecified injury of right lower leg, initial encounter: Secondary | ICD-10-CM | POA: Diagnosis present

## 2023-08-30 MED ORDER — OXYCODONE-ACETAMINOPHEN 5-325 MG PO TABS
1.0000 | ORAL_TABLET | Freq: Once | ORAL | Status: DC
  Filled 2023-08-30: qty 1

## 2023-08-30 MED ORDER — IBUPROFEN 400 MG PO TABS
400.0000 mg | ORAL_TABLET | Freq: Once | ORAL | Status: AC
  Administered 2023-08-30: 400 mg via ORAL
  Filled 2023-08-30: qty 1

## 2023-08-30 MED ORDER — ACETAMINOPHEN 500 MG PO TABS
1000.0000 mg | ORAL_TABLET | Freq: Once | ORAL | Status: AC
  Administered 2023-08-30: 1000 mg via ORAL
  Filled 2023-08-30: qty 2

## 2023-08-30 NOTE — ED Provider Notes (Signed)
[place] [facility]GENC[facility] DEPARTMENT AT [facility] Provider Note   CSN: [id number] Arrival date & time: 08/30/23  1151     History  Chief Complaint  Patient presents with   Knee Injury    Cynthia Li is a 67 y.o. female.  This is a 67 year old female is here today for bilateral knee pain.  Patient tripped while she was carrying Christmas decorations and landed on both of her knees and hands.  She is endorsing right knee pain worse than left knee pain.  She says that her hands and wrists feel okay.  She did not strike her head.  No blood thinners.        Home Medications Prior to Admission medications   Medication Sig Start Date End Date Taking? Authorizing Provider  ALPRAZolam Prudy Feeler) 0.5 MG tablet Take 0.5 mg by mouth daily as needed (panic attacks.).  09/08/16   [provider]  Azelastine HCl 0.15 % SOLN azelastine 205.5 mcg (0.15 %) nasal spray    [provider]  Cholecalciferol (VITAMIN D-3) 125 MCG (5000 UT) TABS Take 5,000 Units by mouth at bedtime. 10/26/19   Kathryne Hitch, MD  clotrimazole (LOTRIMIN) 1 % cream Apply 1 application topically 2 (two) times daily.    [provider]  diclofenac sodium (VOLTAREN) 1 % GEL Apply 1 application topically daily as needed (arthritis in hands).  03/16/18   [provider]  docusate sodium (COLACE) 100 MG capsule Take 100 mg by mouth 2 (two) times daily.    [provider]  Glucos-Chond-Hyal Ac-Ca Fructo (MOVE FREE JOINT HEALTH ADVANCE PO) Take 1 tablet by mouth every evening.    [provider]  levocetirizine (XYZAL) 5 MG tablet Take 5 mg by mouth at bedtime.  10/10/18   [provider]  magnesium oxide (MAG-OX) 400 MG tablet Take 800 mg by mouth at bedtime.    [provider]  Multiple Vitamin (MULTIVITAMIN WITH MINERALS) TABS tablet Take 1 tablet by mouth daily. One-A-Day Multivitamin Women 50+    [provider]  nystatin cream  (MYCOSTATIN) Apply 1 Application topically 2 (two) times daily. Apply to affected area BID for up to 7 days. 11/06/22   Jerene Bears, MD  Omega-3 Fatty Acids (OMEGA 3 500) 500 MG CAPS Take 500 mg by mouth every evening.    [provider]  pantoprazole (PROTONIX) 40 MG tablet Take 40 mg by mouth daily. 11/22/16   [provider]  rosuvastatin (CRESTOR) 5 MG tablet 1 tablet    [provider]  vitamin E 400 UNIT capsule Take 400 Units by mouth at bedtime.    [provider]      Allergies    Codeine, Hydrocodone, Influenza virus vaccine, Other, and Penicillins    Review of Systems   Review of Systems  Physical Exam Updated Vital Signs BP (!) 154/73 (BP Location: Right Arm)   Pulse (!) 59   Temp 98.3 F (36.8 C)   Resp 18   LMP 09/22/2006   SpO2 99%  Physical Exam Vitals reviewed.  HENT:     Head: Normocephalic and atraumatic.  Cardiovascular:     Pulses: Normal pulses.  Pulmonary:     Effort: Pulmonary effort is normal.  Abdominal:     General: Abdomen is flat.     Palpations: Abdomen is soft.  Musculoskeletal:     Cervical back: Normal range of motion. No rigidity or tenderness.     Comments: Patient with abrasions to  both knees.  Right knee more swollen, tender at the tibial plateau.  Left knee with no tibial plateau tenderness.  Neurological:     Mental Status: She is alert.     ED Results / Procedures / Treatments   Labs (all labs ordered are listed, but only abnormal results are displayed) Labs Reviewed - No data to display  EKG None  Radiology CT EXTREMITY LOWER RIGHT WO CONTRAST  Result Date: 08/30/2023 CLINICAL DATA:  Fall with knee pain. EXAM: CT OF THE LOWER RIGHT EXTREMITY WITHOUT CONTRAST TECHNIQUE: Multidetector CT imaging of the right lower extremity was performed according to the standard protocol. RADIATION DOSE REDUCTION: This exam was performed according to the departmental dose-optimization program which includes  automated exposure control, adjustment of the mA and/or kV according to patient size and/or use of iterative reconstruction technique. COMPARISON:  Same day knee radiograph. FINDINGS: Bones/Joint/Cartilage There is a minimally displaced fracture of the tibial spine extending to the anterior aspect of the tibial plateau. There is a small to moderate knee joint effusion. No joint dislocation. Mild-to-moderate tricompartmental osteoarthritis. Ligaments Suboptimally assessed by CT. Muscles and Tendons Grossly normal. Soft tissues There is mild soft tissue swelling around the knee. IMPRESSION: Minimally displaced fracture of the tibial spine extending to the anterior aspect of the tibial plateau. Small to moderate knee joint effusion. Electronically Signed   By: Romona Curls M.D.   On: 08/30/2023 14:28   DG Knee Complete 4 Views Right  Result Date: 08/30/2023 CLINICAL DATA:  Fall with knee pain. EXAM: RIGHT KNEE - COMPLETE 4+ VIEW COMPARISON:  Contralateral knee radiographs performed the same day. FINDINGS: There is suggestion of cortical defect of the tibial spine on a single view. No joint dislocation. There is a small knee joint effusion. There is mild-to-moderate tricompartmental osteoarthritis. Soft tissues are unremarkable. IMPRESSION: Suggestion of cortical defect of the tibial spine on a single view may reflect an acute fracture. A CT of the knee could be performed for further evaluation. Electronically Signed   By: Romona Curls M.D.   On: 08/30/2023 13:03   DG Knee Complete 4 Views Left  Result Date: 08/30/2023 CLINICAL DATA:  Fall with knee pain. EXAM: LEFT KNEE - COMPLETE 4+ VIEW COMPARISON:  Contralateral knee radiographs performed the same day. FINDINGS: No evidence of fracture, dislocation, or joint effusion. There is mild tricompartmental osteoarthritis. Soft tissues are unremarkable. IMPRESSION: No acute fracture or dislocation. Electronically Signed   By: Romona Curls M.D.   On: 08/30/2023  13:00    Procedures Procedures    Medications Ordered in ED Medications  oxyCODONE-acetaminophen (PERCOCET/ROXICET) 5-325 MG per tablet 1 tablet (1 tablet Oral Patient Refused/Not Given 08/30/23 1402)  acetaminophen (TYLENOL) tablet 1,000 mg (has no administration in time range)  ibuprofen (ADVIL) tablet 400 mg (has no administration in time range)    ED Course/ Medical Decision Making/ A&P                                 Medical Decision Making 67 year old female here today with bilateral knee pain after a fall.  Differential diagnoses include tibial plateau fracture on the right, knee contusion, nonsyncopal fall from standing.  Plan- patient overall looks well aside from her right knee.  Plain films show possible fracture.  Will obtain CT imaging.  Patient declining pain medication at this time.  Considered labs and the patient, however with her nonsyncopal fall from standing do not  believe it is indicated at this time.  Patient overall looks well, is a reliable historian.  Reassessment 2:30 PM-patient's CT scan in ED shows tibial plateau fracture.  Have ordered knee immobilizer.  Will provide with outpatient follow-up for the patient.  Amount and/or Complexity of Data Reviewed Radiology: ordered.  Risk Prescription drug management.           Final Clinical Impression(s) / ED Diagnoses Final diagnoses:  Tibial plateau fracture, right, closed, initial encounter    Rx / DC Orders ED Discharge Orders     None         Anders Simmonds T, DO 08/30/23 1453

## 2023-08-30 NOTE — ED Triage Notes (Signed)
She states she tripped over an object, which caused her to fall in her driveway. She c/o bilat. Knee injury R > L,

## 2023-08-30 NOTE — ED Notes (Signed)
Patient refused pain medications, stating she would rather take ibuprofen or tylenol instead. Provider made aware.

## 2023-08-30 NOTE — Discharge Instructions (Addendum)
You have a tibial plateau fracture.  Keep your knee in a knee immobilizer.  You can take Tylenol and ibuprofen at home.  Please call Dr. Nilsa Nutting office tomorrow morning to set up a follow-up appointment.  You can take 1000 mg of Tylenol every 8 hours, 400 mg of ibuprofen every 6 hours.

## 2023-08-31 ENCOUNTER — Encounter: Payer: Self-pay | Admitting: Physician Assistant

## 2023-08-31 ENCOUNTER — Ambulatory Visit (INDEPENDENT_AMBULATORY_CARE_PROVIDER_SITE_OTHER): Payer: Medicare Other | Admitting: Physician Assistant

## 2023-08-31 VITALS — Ht 65.5 in | Wt 199.0 lb

## 2023-08-31 DIAGNOSIS — S82141D Displaced bicondylar fracture of right tibia, subsequent encounter for closed fracture with routine healing: Secondary | ICD-10-CM | POA: Diagnosis not present

## 2023-08-31 MED ORDER — HYDROCODONE-ACETAMINOPHEN 5-325 MG PO TABS: 1.0000 | ORAL_TABLET | Freq: Four times a day (QID) | ORAL | 0 refills | Status: AC | PRN

## 2023-08-31 NOTE — Progress Notes (Addendum)
HPI: Cynthia Li is well-known to Dr. Eliberto Ivory service comes in today status post a fall yesterday when she tripped carrying Christmas decorations and landed on both of her knees.  She was seen in the ER where radiographs of her knees and a CT scan of her right knee was performed.  I have reviewed the films personally.  Left knee radiographs show no evidence of fracture or dislocation.  Mild tricompartmental arthritis.  No acute findings.  Right knee radiographs show the knee to be well located.  On the AP view  there is a slight lucency in the area of the tibial spine. No other acute findings. CT scan Right knee shows a minimally displaced tibial spine fracture that extends into the tibial plateau. Moderate joint effusion.  She was placed in a pullover hinged knee sleeve.  She was told to follow-up with our office.  She was also given crutches to aid in ambulation.  Review of systems: See HPI otherwise negative or noncontributory  Physical exam: General Well-developed well-nourished pleasant female no acute distress mood affect appropriate. Bilateral knees: Abrasions over the anterior aspect of both knees no abnormal warmth of either knee.  Slight effusion both knees.  Left knee good range of motion of the left knee without pain.  No range of motion 10 to the right knee today.  Calves are supple and nontender.  Impression: Bilateral knee abrasions Right knee tibial plateau fracture nondisplaced  Plan: Will discontinue the knee brace. Xeroform was applied over the anterior aspects of both knees where she has abrasions and Tegaderm dressings applied she will leave these on for 3 days.  She is nonweightbearing on the right.  No deep flexion.  She is encouraged to use her crutches she can touchdown for balance.  Elevation wiggling of toes and ankles encouraged.  We also placed her on an 81 mg aspirin once daily.  Will see her back in 2 weeks at that time we will obtain AP and lateral views of the right  knee.  Questions were encouraged and answered by Dr. Magnus Ivan and myself.  She is given hydrocodone for pain as she has been able to tolerate this in the past.

## 2023-09-14 ENCOUNTER — Encounter: Payer: Self-pay | Admitting: Physician Assistant

## 2023-09-14 ENCOUNTER — Ambulatory Visit: Payer: BC Managed Care – PPO | Admitting: Physician Assistant

## 2023-09-14 ENCOUNTER — Other Ambulatory Visit (INDEPENDENT_AMBULATORY_CARE_PROVIDER_SITE_OTHER): Payer: Medicare Other

## 2023-09-14 DIAGNOSIS — S82141D Displaced bicondylar fracture of right tibia, subsequent encounter for closed fracture with routine healing: Secondary | ICD-10-CM | POA: Diagnosis not present

## 2023-09-14 NOTE — Progress Notes (Signed)
HPI: Mrs. Mixson returns today follow-up of her right tibial plateau fracture that is nondisplaced.  She has been nonweightbearing using crutches to ambulate.  She notes some swelling but overall is slowly improving.  She is taking Motrin and Tylenol for pain.  Physical exam: General: Well-developed well-nourished female no acute distress mood affect appropriate. Right knee: No abnormal warmth erythema or effusion.  Full extension no attempts of flexion.  Calf supple nontender. Left ankle tenderness at the Achilles insertion Achilles otherwise intact.  No tenderness medial tubercle of the calcaneus.  Radiographs: Right knee 2 views: Knee is well located.  Tibial spine fracture site remains nondisplaced.  Early signs of callus formation are seen.  No other acute fractures or acute findings.  Impression: Nondisplaced tibial spine fracture Left insertional Achilles tendinitis  Plan: Recommend that she remain nonweightbearing on the right side for another 2 weeks then she can go 50% weightbearing.  Will see her back in 4 weeks at time obtain an AP and lateral view of her right knee.  In regards to her insertional Achilles tendinitis discussed shoewear.  Also discussed use of Voltaren gel 2 g 4 times daily to the area of maximal tenderness.  She shown gastrocsoleus stretching exercises for the left leg.

## 2023-10-12 ENCOUNTER — Ambulatory Visit (INDEPENDENT_AMBULATORY_CARE_PROVIDER_SITE_OTHER): Payer: Medicare Other | Admitting: Physician Assistant

## 2023-10-12 ENCOUNTER — Other Ambulatory Visit: Payer: Self-pay

## 2023-10-12 DIAGNOSIS — S82141D Displaced bicondylar fracture of right tibia, subsequent encounter for closed fracture with routine healing: Secondary | ICD-10-CM

## 2023-10-12 NOTE — Progress Notes (Signed)
HPI: Mrs. Pendell returns today follow-up now 6 weeks status post right tibial plateau fracture which is nondisplaced she has been 50% weightbearing.  She states she has no pain.  She notes that her Achilles pain on the left is much better after doing exercises as shown.  She is taking no pain medications.  Physical exam: General: Well-developed well-nourished female no acute distress. Right knee: Good range of motion of the right knee anterior posterior drawer negative.  No abnormal warmth erythema or effusion.  Radiographs: 2 views right knee: Knee is well located.  The tibial plateau fracture shows further consolidation.  No other fractures identified.  Impression: Right tibial plateau fracture  Plan: Will have her follow-up with Dr. Magnus Ivan in 4 weeks see how she is doing overall.  She is stent to [facility] physical therapy per her request to work on strengthening lower extremities, range of motion right knee and gait and balance.  Questions were encouraged and answered at length.  She is now weightbearing as tolerated on the right lower extremity.

## 2023-11-24 ENCOUNTER — Other Ambulatory Visit: Payer: Self-pay | Admitting: Internal Medicine

## 2023-11-24 DIAGNOSIS — Z1231 Encounter for screening mammogram for malignant neoplasm of breast: Secondary | ICD-10-CM

## 2023-11-25 ENCOUNTER — Other Ambulatory Visit: Payer: Self-pay | Admitting: Internal Medicine

## 2023-11-25 DIAGNOSIS — M8580 Other specified disorders of bone density and structure, unspecified site: Secondary | ICD-10-CM

## 2023-12-18 ENCOUNTER — Ambulatory Visit
Admission: RE | Admit: 2023-12-18 | Discharge: 2023-12-18 | Disposition: A | Discharge status: Home / Self Care | Source: Ambulatory Visit | Attending: Internal Medicine | Admitting: Internal Medicine

## 2023-12-18 DIAGNOSIS — Z1231 Encounter for screening mammogram for malignant neoplasm of breast: Secondary | ICD-10-CM

## 2023-12-29 ENCOUNTER — Other Ambulatory Visit

## 2024-01-07 ENCOUNTER — Ambulatory Visit
Admission: RE | Admit: 2024-01-07 | Discharge: 2024-01-07 | Disposition: A | Discharge status: Home / Self Care | Source: Ambulatory Visit | Attending: Internal Medicine | Admitting: Internal Medicine

## 2024-01-07 DIAGNOSIS — M8580 Other specified disorders of bone density and structure, unspecified site: Secondary | ICD-10-CM

## 2024-03-14 ENCOUNTER — Encounter (HOSPITAL_BASED_OUTPATIENT_CLINIC_OR_DEPARTMENT_OTHER): Payer: Self-pay | Admitting: Student

## 2024-03-14 ENCOUNTER — Ambulatory Visit (INDEPENDENT_AMBULATORY_CARE_PROVIDER_SITE_OTHER)

## 2024-03-14 ENCOUNTER — Ambulatory Visit (INDEPENDENT_AMBULATORY_CARE_PROVIDER_SITE_OTHER): Admitting: Student

## 2024-03-14 DIAGNOSIS — R2681 Unsteadiness on feet: Secondary | ICD-10-CM

## 2024-03-14 DIAGNOSIS — M25561 Pain in right knee: Secondary | ICD-10-CM

## 2024-03-14 DIAGNOSIS — S82141D Displaced bicondylar fracture of right tibia, subsequent encounter for closed fracture with routine healing: Secondary | ICD-10-CM | POA: Diagnosis not present

## 2024-03-14 NOTE — Progress Notes (Signed)
 Chief Complaint: Right knee pain    Discussed the use of AI scribe software for clinical note transcription with the patient, who gave verbal consent to proceed.  History of Present Illness Cynthia Li is a 68 year old female with a history of bilateral hip replacements who presents with knee pain and recent falls.  She had a fall on June 4th, 2025, due to tripping on a higher garage floor, and is concerned about falling again as she lives alone. She takes ibuprofen  and Tylenol  every morning for pain management, which decreases her knee pain. She had a tibial plateau fracture in December 2024, requiring six weeks of non-weight bearing and is concerned about potentially re-fracturing her knee. A hard knot developed after her recent fall, which resolved with ibuprofen  and ice. She underwent right knee arthroscopy in 2000 and has extensive lumbar history. She describes occasional pain radiating from her hip to her knee and reports a sluggish foot, which sometimes does not move as intended, leading to tripping.   Surgical History:   Right knee arthroscopy, approximately 2000  PMH/PSH/Family History/Social History/Meds/Allergies:    Past Medical History:  Diagnosis Date   ADD (attention deficit disorder)    Anxiety    Arthritis    Back pain    GERD (gastroesophageal reflux disease)    Hiatal hernia    IBS (irritable bowel syndrome)    age 48   Joint pain    Kidney stone 3/14   Lumbar disc disease    Osteoarthritis    Panic disorder    Pinched nerve    some DDD   Vitamin D  deficiency    Past Surgical History:  Procedure Laterality Date   BACK SURGERY  09/13/2015   Back Fusion- [facility]    CHOLECYSTECTOMY  age 31   CYSTOSCOPY W/ URETERAL STENT PLACEMENT Left 10/13/2014   Procedure: CYSTOSCOPY WITH RETROGRADE PYELOGRAM/URETERAL STENT PLACEMENT;  Surgeon: Ricardo Likens, MD;  Location: [facility];  Service: Urology;  Laterality:  Left;   HOLMIUM LASER APPLICATION Left 10/13/2014   Procedure: HOLMIUM LASER APPLICATION;  Surgeon: Ricardo Likens, MD;  Location: [facility];  Service: Urology;  Laterality: Left;   KNEE ARTHROSCOPY     right   NEPHROLITHOTOMY Left 10/13/2014   Procedure: NEPHROLITHOTOMY PERCUTANEOUS WITH SURGEON ACCESS;  Surgeon: Ricardo Likens, MD;  Location: [facility];  Service: Urology;  Laterality: Left;   TOTAL ABDOMINAL HYSTERECTOMY  09/2006   TOTAL HIP ARTHROPLASTY Left 07/01/2019   Procedure: LEFT TOTAL HIP ARTHROPLASTY ANTERIOR APPROACH;  Surgeon: Vernetta Lonni GRADE, MD;  Location: [facility];  Service: Orthopedics;  Laterality: Left;   TOTAL HIP ARTHROPLASTY Right 09/02/2019   Procedure: RIGHT TOTAL HIP ARTHROPLASTY ANTERIOR APPROACH;  Surgeon: Vernetta Lonni GRADE, MD;  Location: [facility];  Service: Orthopedics;  Laterality: Right;   Social History   Socioeconomic History   Marital status: Single    Spouse name: Not on file   Number of children: Not on file   Years of education: Not on file   Highest education level: Not on file  Occupational History   Not on file  Tobacco Use   Smoking status: Never   Smokeless tobacco: Never  Vaping Use   Vaping status: Never Used  Substance and Sexual Activity   Alcohol use: Yes    Comment:  occ   Drug use: No   Sexual activity: Not Currently    Partners: Male    Birth control/protection: Surgical    Comment: TAH  Other Topics Concern   Not on file  Social History Narrative   Not on file   Social Drivers of Health   Financial Resource Strain: Not on file  Food Insecurity: Not on file  Transportation Needs: Not on file  Physical Activity: Not on file  Stress: Not on file  Social Connections: Not on file   Family History  Problem Relation Age of Onset   Parkinson's disease Father        deceased   Cancer Father        renal tumor   Heart attack Father    Alzheimer's disease Father    Hypertension Father    Alcoholism Father     Ulcerative colitis Sister    Breast cancer Neg Hx    BRCA 1/2 Neg Hx     Current Outpatient Medications  Medication Sig Dispense Refill   ALPRAZolam  (XANAX ) 0.5 MG tablet Take 0.5 mg by mouth daily as needed (panic attacks.).   0   Azelastine HCl 0.15 % SOLN azelastine 205.5 mcg (0.15 %) nasal spray     Cholecalciferol  (VITAMIN D -3) 125 MCG (5000 UT) TABS Take 5,000 Units by mouth at bedtime. 30 tablet 6   clotrimazole (LOTRIMIN) 1 % cream Apply 1 application topically 2 (two) times daily.     diclofenac sodium (VOLTAREN) 1 % GEL Apply 1 application topically daily as needed (arthritis in hands).   3   docusate sodium  (COLACE) 100 MG capsule Take 100 mg by mouth 2 (two) times daily.     Glucos-Chond-Hyal Ac-Ca Fructo (MOVE FREE JOINT HEALTH ADVANCE PO) Take 1 tablet by mouth every evening.     HYDROcodone -acetaminophen  (NORCO/VICODIN) 5-325 MG tablet Take 1 tablet by mouth every 6 (six) hours as needed for moderate pain (pain score 4-6). 30 tablet 0   levocetirizine (XYZAL ) 5 MG tablet Take 5 mg by mouth at bedtime.      magnesium  oxide (MAG-OX) 400 MG tablet Take 800 mg by mouth at bedtime.     Multiple Vitamin (MULTIVITAMIN WITH MINERALS) TABS tablet Take 1 tablet by mouth daily. One-A-Day Multivitamin Women 50+     nystatin  cream (MYCOSTATIN ) Apply 1 Application topically 2 (two) times daily. Apply to affected area BID for up to 7 days. 30 g 2   Omega-3 Fatty Acids (OMEGA 3 500) 500 MG CAPS Take 500 mg by mouth every evening.     pantoprazole  (PROTONIX ) 40 MG tablet Take 40 mg by mouth daily.     rosuvastatin (CRESTOR) 5 MG tablet 1 tablet     vitamin E  400 UNIT capsule Take 400 Units by mouth at bedtime.     No current facility-administered medications for this visit.   No results found.  Review of Systems:   A ROS was performed including pertinent positives and negatives as documented in the HPI.  Physical Exam :   Constitutional: NAD and appears stated age Neurological: Alert  and oriented Psych: Appropriate affect and cooperative Last menstrual period 09/22/2006.   Comprehensive Musculoskeletal Exam:    Right knee exam demonstrates active range of motion from 0 to 120 degrees with crepitus.  No obvious deformity or notable effusion, erythema, or warmth.  No patellar or joint line tenderness.  Stable collaterals with varus and valgus stress.  Right knee flexion/extension and right ankle dorsiflexion/plantarflexion strength is  5/5.  Imaging:   Xray (right knee 4 views): No evidence of acute fracture or dislocation.  Chondrocalcinosis noted in the lateral joint space.  Moderate degenerative changes within the patellofemoral compartment.   I personally reviewed and interpreted the radiographs.      Assessment & Plan Knee pain with osteoarthritis Patient has a history of a right tibial plateau fracture approximately 6 months ago and sustained a fall 3 [place] ago while [place] the beach.  Her knee does continue to be bothersome however is not as significant as her prior injury.  No evidence of fracture or acute abnormality on today's x-rays, although this does demonstrate some mild to moderate osteoarthritis most notable in the patellofemoral compartment.  Patient does express concern about a combination of her neuropathy, low back issues, and gait unsteadiness contributing to falls, and she is concerned about sustaining repeat falls.  I will plan to refer her to physical therapy for evaluation and treatment, particularly to address balance and gait, but may also be able to benefit from some targeted knee and low back modalities.  Current pain is managed with ibuprofen  and Tylenol .  Could consider steroid injection of the knee if pain persists or worsens.       I personally saw and evaluated the patient, and participated in the management and treatment plan.  Leonce Reveal, PA-C Orthopedics

## 2024-03-15 NOTE — Addendum Note (Signed)
 Addended by: TRINDA DEANE HERO on: 03/15/2024 10:03 AM   Modules accepted: Orders

## 2024-07-25 ENCOUNTER — Encounter: Payer: Self-pay | Admitting: Radiology

## 2024-11-04 ENCOUNTER — Ambulatory Visit (HOSPITAL_BASED_OUTPATIENT_CLINIC_OR_DEPARTMENT_OTHER): Payer: BC Managed Care – PPO | Admitting: Obstetrics & Gynecology

## 2024-11-15 ENCOUNTER — Ambulatory Visit (HOSPITAL_BASED_OUTPATIENT_CLINIC_OR_DEPARTMENT_OTHER): Admitting: Obstetrics & Gynecology
